# Patient Record
Sex: Female | Born: 1995 | Race: White | Hispanic: No | Marital: Single | Smoking: Never smoker
Health system: Southern US, Community
[De-identification: ages and names within clinical notes are randomized; demographics above are authoritative.]

## PROBLEM LIST (undated history)

## (undated) DIAGNOSIS — K589 Irritable bowel syndrome without diarrhea: Secondary | ICD-10-CM

## (undated) HISTORY — DX: Irritable bowel syndrome, unspecified: K58.9

## (undated) HISTORY — PX: WISDOM TOOTH EXTRACTION: SHX21

---
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
## 2017-01-03 ENCOUNTER — Ambulatory Visit: Payer: Self-pay | Admitting: Family Medicine

## 2017-01-10 ENCOUNTER — Ambulatory Visit (INDEPENDENT_AMBULATORY_CARE_PROVIDER_SITE_OTHER): Payer: Managed Care, Other (non HMO) | Admitting: Family Medicine

## 2017-01-10 ENCOUNTER — Encounter: Payer: Self-pay | Admitting: Family Medicine

## 2017-01-10 VITALS — BP 98/60 | HR 76 | Temp 98.9°F | Resp 16 | Ht 65.5 in | Wt 117.0 lb

## 2017-01-10 DIAGNOSIS — K580 Irritable bowel syndrome with diarrhea: Secondary | ICD-10-CM

## 2017-01-10 DIAGNOSIS — Z7689 Persons encountering health services in other specified circumstances: Secondary | ICD-10-CM | POA: Diagnosis not present

## 2017-01-10 DIAGNOSIS — K529 Noninfective gastroenteritis and colitis, unspecified: Secondary | ICD-10-CM | POA: Diagnosis not present

## 2017-01-10 LAB — COMPLETE METABOLIC PANEL WITH GFR
ALT: 15 U/L (ref 6–29)
AST: 18 U/L (ref 10–30)
Albumin: 4.9 g/dL (ref 3.6–5.1)
Alkaline Phosphatase: 45 U/L (ref 33–115)
BUN: 11 mg/dL (ref 7–25)
CO2: 23 mmol/L (ref 20–31)
Calcium: 9.9 mg/dL (ref 8.6–10.2)
Chloride: 103 mmol/L (ref 98–110)
Creat: 0.75 mg/dL (ref 0.50–1.10)
GFR, Est African American: >89 mL/min (ref >=60)
GFR, Est Non African American: >89 mL/min (ref >=60)
Glucose, Bld: 81 mg/dL (ref 65–99)
Potassium: 4.2 mmol/L (ref 3.5–5.3)
Sodium: 139 mmol/L (ref 135–146)
Total Bilirubin: 0.4 mg/dL (ref 0.2–1.2)
Total Protein: 7.7 g/dL (ref 6.1–8.1)

## 2017-01-10 LAB — URINALYSIS, ROUTINE W REFLEX MICROSCOPIC
Bilirubin Urine: NEGATIVE
Glucose, UA: NEGATIVE
Ketones, ur: NEGATIVE
Leukocytes, UA: NEGATIVE
Nitrite: NEGATIVE
Specific Gravity, Urine: 1.027 (ref 1.001–1.035)
pH: 6 (ref 5.0–8.0)

## 2017-01-10 LAB — CBC WITH DIFFERENTIAL/PLATELET
Basophils Absolute: 45 cells/uL (ref 0–200)
Basophils Relative: 1 %
Eosinophils Absolute: 180 cells/uL (ref 15–500)
Eosinophils Relative: 4 %
HCT: 44.3 % (ref 35.0–45.0)
Hemoglobin: 14.6 g/dL (ref 11.7–15.5)
Lymphocytes Relative: 27 %
Lymphs Abs: 1215 cells/uL (ref 850–3900)
MCH: 27.8 pg (ref 27.0–33.0)
MCHC: 33 g/dL (ref 32.0–36.0)
MCV: 84.2 fL (ref 80.0–100.0)
MPV: 10 fL (ref 7.5–12.5)
Monocytes Absolute: 405 cells/uL (ref 200–950)
Monocytes Relative: 9 %
Neutro Abs: 2655 cells/uL (ref 1500–7800)
Neutrophils Relative %: 59 %
Platelets: 245 10*3/uL (ref 140–400)
RBC: 5.26 MIL/uL — ABNORMAL HIGH (ref 3.80–5.10)
RDW: 13.9 % (ref 11.0–15.0)
WBC: 4.5 10*3/uL (ref 3.8–10.8)

## 2017-01-10 LAB — URINALYSIS, MICROSCOPIC ONLY
Casts: NONE SEEN [LPF]
Squamous Epithelial / HPF: NONE SEEN [HPF] (ref <=5)
Yeast: NONE SEEN [HPF]

## 2017-01-10 NOTE — Progress Notes (Signed)
Chief Complaint  Patient presents with  . Establish Care   Healthy 21 year old Lives at home Works and attends college Up to date with immunizations Not sexually active  New to establish  Only complaint is of chronic diarrhea.  Present for 2 years.  No blood or mucous or weight loss.  Worse with certain foods.  Worse with stress.  Eating large meal seems to stimulate bowel and results in diarrhea.  Afraid to eat at restaurants. Has tried food elimination diets.  Is not lactose intolerant.  Does not think it is gluten.   Sees some improvement with peppermint oil.  Has  Not tried any imodium or probiotics or other supplements.  Patient Active Problem List   Diagnosis Date Noted  . Irritable bowel syndrome with diarrhea 01/10/2017    Outpatient Encounter Prescriptions as of 01/10/2017  Medication Sig  . ibuprofen (ADVIL,MOTRIN) 200 MG tablet Take 200 mg by mouth every 6 (six) hours as needed.   No facility-administered encounter medications on file as of 01/10/2017.     No past medical history on file.  Past Surgical History:  Procedure Laterality Date  . WISDOM TOOTH EXTRACTION      Social History   Social History  . Marital status: Single    Spouse name: N/A  . Number of children: N/A  . Years of education: 57   Occupational History  . student    Social History Main Topics  . Smoking status: Never Smoker  . Smokeless tobacco: Never Used  . Alcohol use No  . Drug use: No  . Sexual activity: Yes   Other Topics Concern  . 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 Diabetes Maternal Grandfather   . Diabetes Paternal Grandmother   . Cancer Paternal Grandfather     "abdominal"    Review of Systems  Constitutional: Negative for chills, fever and weight loss.  HENT: Negative for congestion and hearing loss.   Eyes: Negative  for blurred vision and pain.  Respiratory: Negative for cough and shortness of breath.   Cardiovascular: Negative for chest pain and leg swelling.  Gastrointestinal: Positive for abdominal pain and diarrhea. Negative for constipation and heartburn.  Genitourinary: Negative for dysuria and frequency.  Musculoskeletal: Negative for falls, joint pain and myalgias.  Neurological: Negative for dizziness, seizures and headaches.  Psychiatric/Behavioral: Negative for depression. The patient is not nervous/anxious and does not have insomnia.     BP 98/60 (BP Location: Right Arm, Patient Position: Sitting, Cuff Size: Normal)   Pulse 76   Temp 98.9 F (37.2 C) (Temporal)   Resp 16   Ht 5' 5.5" (1.664 m)   Wt 117 lb 0.6 oz (53.1 kg)   LMP 01/09/2017 (Exact Date)   SpO2 100%   BMI 19.18 kg/m   Physical Exam  Constitutional: She is oriented to person, place, and time. She appears well-developed and well-nourished.  HENT:  Head: Normocephalic and atraumatic.  Right Ear: External ear normal.  Left Ear: External ear normal.  Mouth/Throat: Oropharynx is clear and moist.  Eyes: Conjunctivae are normal. Pupils are equal, round, and reactive to light.  Neck: Normal range of motion. Neck supple. No thyromegaly present.  Cardiovascular: Normal rate, regular rhythm and normal heart sounds.   Pulmonary/Chest: Effort normal and breath sounds normal. No  respiratory distress.  Abdominal: Soft. Bowel sounds are normal.  Musculoskeletal: Normal range of motion. She exhibits no edema.  Lymphadenopathy:    She has no cervical adenopathy.  Neurological: She is alert and oriented to person, place, and time.  Gait normal  Skin: Skin is warm and dry.  Psychiatric: She has a normal mood and affect. Her behavior is normal. Thought content normal.  Nursing note and vitals reviewed.  ASSESSMENT/PLAN:  1. Chronic diarrhea  - CBC with Differential/Platelet - COMPLETE METABOLIC PANEL WITH GFR - Urinalysis,  Routine w reflex microscopic - C-reactive protein - Celiac panel 10  2. Irritable bowel syndrome with diarrhea  Discussed IBS.  Discussed fiber diet.  Discussed D-Dx of inflammatory bowel disease, food allergy and IBS.    - CBC with Differential/Platelet - COMPLETE METABOLIC PANEL WITH GFR - Urinalysis, Routine w reflex microscopic - C-reactive protein - Celiac panel 10  3. New to establish with doctor   Patient Instructions  Drink plenty of water Take imodium as needed for loose BM Labs today See me in a month    Diet for Irritable Bowel Syndrome When you have irritable bowel syndrome (IBS), the foods you eat and your eating habits are very important. IBS may cause various symptoms, such as abdominal pain, constipation, or diarrhea. Choosing the right foods can help ease discomfort caused by these symptoms. Work with your health care provider and dietitian to find the best eating plan to help control your symptoms. What general guidelines do I need to follow?  Keep a food diary. This will help you identify foods that cause symptoms. Write down:  What you eat and when.  What symptoms you have.  When symptoms occur in relation to your meals.  Avoid foods that cause symptoms. Talk with your dietitian about other ways to get the same nutrients that are in these foods.  Eat more foods that contain fiber. Take a fiber supplement if directed by your dietitian.  Eat your meals slowly, in a relaxed setting.  Aim to eat 5-6 small meals per day. Do not skip meals.  Drink enough fluids to keep your urine clear or pale yellow.  Ask your health care provider if you should take an over-the-counter probiotic during flare-ups to help restore healthy gut bacteria.  If you have cramping or diarrhea, try making your meals low in fat and high in carbohydrates. Examples of carbohydrates are pasta, rice, whole grain breads and cereals, fruits, and vegetables.  If dairy products cause  your symptoms to flare up, try eating less of them. You might be able to handle yogurt better than other dairy products because it contains bacteria that help with digestion. What foods are not recommended? The following are some foods and drinks that may worsen your symptoms:  Fatty foods, such as [place] fries.  Milk products, such as cheese or ice cream.  Chocolate.  Alcohol.  Products with caffeine, such as coffee.  Carbonated drinks, such as soda. The items listed above may not be a complete list of foods and beverages to avoid. Contact your dietitian for more information.  What foods are good sources of fiber? Your health care provider or dietitian may recommend that you eat more foods that contain fiber. Fiber can help reduce constipation and other IBS symptoms. Add foods with fiber to your diet a little at a time so that your body can get used to them. Too much fiber at once might cause gas and swelling of your abdomen. The  following are some foods that are good sources of fiber:  Apples.  Peaches.  Pears.  Berries.  Figs.  Broccoli (raw).  Cabbage.  Carrots.  Raw peas.  Kidney beans.  Lima beans.  Whole grain bread.  Whole grain cereal. Where to find more information: [employer] for Functional Gastrointestinal Disorders: www.iffgd.[employer] of Diabetes and Digestive and Kidney Diseases: http://norris-lawson.com/.aspx This information is not intended to replace advice given to you by your health care provider. Make sure you discuss any questions you have with your health care provider. Document Released: 12/09/2003 Document Revised: 02/24/2016 Document Reviewed: 12/19/2013 Elsevier Interactive Patient Education  2017 Elsevier Inc.    Eustace Moore, MD

## 2017-01-10 NOTE — Patient Instructions (Addendum)
Drink plenty of water Take imodium as needed for loose BM Labs today See me in a month    Diet for Irritable Bowel Syndrome When you have irritable bowel syndrome (IBS), the foods you eat and your eating habits are very important. IBS may cause various symptoms, such as abdominal pain, constipation, or diarrhea. Choosing the right foods can help ease discomfort caused by these symptoms. Work with your health care provider and dietitian to find the best eating plan to help control your symptoms. What general guidelines do I need to follow?  Keep a food diary. This will help you identify foods that cause symptoms. Write down:  What you eat and when.  What symptoms you have.  When symptoms occur in relation to your meals.  Avoid foods that cause symptoms. Talk with your dietitian about other ways to get the same nutrients that are in these foods.  Eat more foods that contain fiber. Take a fiber supplement if directed by your dietitian.  Eat your meals slowly, in a relaxed setting.  Aim to eat 5-6 small meals per day. Do not skip meals.  Drink enough fluids to keep your urine clear or pale yellow.  Ask your health care provider if you should take an over-the-counter probiotic during flare-ups to help restore healthy gut bacteria.  If you have cramping or diarrhea, try making your meals low in fat and high in carbohydrates. Examples of carbohydrates are pasta, rice, whole grain breads and cereals, fruits, and vegetables.  If dairy products cause your symptoms to flare up, try eating less of them. You might be able to handle yogurt better than other dairy products because it contains bacteria that help with digestion. What foods are not recommended? The following are some foods and drinks that may worsen your symptoms:  Fatty foods, such as [place] fries.  Milk products, such as cheese or ice cream.  Chocolate.  Alcohol.  Products with caffeine, such as coffee.  Carbonated  drinks, such as soda. The items listed above may not be a complete list of foods and beverages to avoid. Contact your dietitian for more information.  What foods are good sources of fiber? Your health care provider or dietitian may recommend that you eat more foods that contain fiber. Fiber can help reduce constipation and other IBS symptoms. Add foods with fiber to your diet a little at a time so that your body can get used to them. Too much fiber at once might cause gas and swelling of your abdomen. The following are some foods that are good sources of fiber:  Apples.  Peaches.  Pears.  Berries.  Figs.  Broccoli (raw).  Cabbage.  Carrots.  Raw peas.  Kidney beans.  Lima beans.  Whole grain bread.  Whole grain cereal. Where to find more information: [employer] for Functional Gastrointestinal Disorders: www.iffgd.[employer] of Diabetes and Digestive and Kidney Diseases: http://norris-lawson.com/.aspx This information is not intended to replace advice given to you by your health care provider. Make sure you discuss any questions you have with your health care provider. Document Released: 12/09/2003 Document Revised: 02/24/2016 Document Reviewed: 12/19/2013 Elsevier Interactive Patient Education  2017 [employer].

## 2017-01-11 LAB — TISSUE TRANSGLUTAMINASE, IGG: Tissue Transglut Ab: 7 U/mL — ABNORMAL HIGH (ref <6)

## 2017-01-11 LAB — ENDOMYSIAL AB IGA RFLX TITER: Endomysial Screen: NEGATIVE

## 2017-01-11 LAB — CELIAC DISEASE COMPREHENSIVE PANEL WITH REFLEXES
IgA: 171 mg/dL (ref 81–463)
Tissue Transglutaminase Ab, IgA: 1 U/mL (ref <4)

## 2017-01-11 LAB — C-REACTIVE PROTEIN: CRP: 0.8 mg/L (ref <8.0)

## 2017-02-14 ENCOUNTER — Ambulatory Visit (INDEPENDENT_AMBULATORY_CARE_PROVIDER_SITE_OTHER): Payer: Managed Care, Other (non HMO) | Admitting: Family Medicine

## 2017-02-14 ENCOUNTER — Encounter: Payer: Self-pay | Admitting: Family Medicine

## 2017-02-14 VITALS — BP 108/62 | HR 68 | Temp 98.3°F | Resp 16 | Ht 66.0 in | Wt 120.1 lb

## 2017-02-14 DIAGNOSIS — K580 Irritable bowel syndrome with diarrhea: Secondary | ICD-10-CM | POA: Diagnosis not present

## 2017-02-14 MED ORDER — DICYCLOMINE HCL 10 MG PO CAPS: 10.0000 mg | ORAL_CAPSULE | Freq: Three times a day (TID) | ORAL | 1 refills | Status: DC

## 2017-02-14 NOTE — Progress Notes (Signed)
    Chief Complaint  Patient presents with  . Follow-up   Feels well in general Still with IBS-D symptoms a couple of times a month, for a few days Not on high fiber diet.  Recommend fiber supplement if cannot get in foods Labs reviewed and are generally normal  Patient Active Problem List   Diagnosis Date Noted  . Irritable bowel syndrome with diarrhea 01/10/2017    Outpatient Encounter Prescriptions as of 02/14/2017  Medication Sig  . ibuprofen (ADVIL,MOTRIN) 200 MG tablet Take 200 mg by mouth every 6 (six) hours as needed.  . dicyclomine (BENTYL) 10 MG capsule Take 1 capsule (10 mg total) by mouth 4 (four) times daily -  before meals and at bedtime.   No facility-administered encounter medications on file as of 02/14/2017.     No Known Allergies  Review of Systems  Constitutional: Negative for activity change, appetite change, fatigue and unexpected weight change.  Gastrointestinal: Positive for diarrhea. Negative for blood in stool and constipation.  Genitourinary: Negative for difficulty urinating and frequency.  Musculoskeletal: Negative for arthralgias and back pain.  Neurological: Negative for dizziness and headaches.  All other systems reviewed and are negative.   BP 108/62 (BP Location: Right Arm, Patient Position: Sitting, Cuff Size: Normal)   Pulse 68   Temp 98.3 F (36.8 C) (Temporal)   Resp 16   Ht 5\' 6"  (1.676 m)   Wt 120 lb 1.9 oz (54.5 kg)   LMP 02/10/2017 (Exact Date)   SpO2 100%   BMI 19.39 kg/m   Physical Exam  Constitutional: She is oriented to person, place, and time. She appears well-developed and well-nourished. No distress.  Cardiovascular: Normal rate, regular rhythm and normal heart sounds.   Pulmonary/Chest: Effort normal and breath sounds normal.  Abdominal: Soft. Bowel sounds are normal. She exhibits no distension. There is no tenderness.  Neurological: She is alert and oriented to person, place, and time.  Psychiatric: She has a normal  mood and affect. Her behavior is normal. Thought content normal.    ASSESSMENT/PLAN:  1. Irritable bowel syndrome with diarrhea  - Ambulatory referral to Gastroenterology   Patient Instructions  High fiber diet is desirable Drink lots of water Take the bentyl as needed See Dr Karilyn Cotaehman in consultation See me yearly    Eustace MooreYvonne Sue Minnie Shi, MD

## 2017-02-14 NOTE — Patient Instructions (Signed)
High fiber diet is desirable Drink lots of water Take the bentyl as needed See Dr Karilyn Cotaehman in consultation See me yearly

## 2017-02-22 ENCOUNTER — Encounter: Payer: Self-pay | Admitting: Gastroenterology

## 2017-03-26 ENCOUNTER — Ambulatory Visit (INDEPENDENT_AMBULATORY_CARE_PROVIDER_SITE_OTHER): Payer: Managed Care, Other (non HMO) | Admitting: Nurse Practitioner

## 2017-03-26 ENCOUNTER — Encounter: Payer: Self-pay | Admitting: Nurse Practitioner

## 2017-03-26 VITALS — BP 111/67 | HR 83 | Temp 97.6°F | Ht 66.0 in | Wt 115.6 lb

## 2017-03-26 DIAGNOSIS — K580 Irritable bowel syndrome with diarrhea: Secondary | ICD-10-CM

## 2017-03-26 NOTE — Patient Instructions (Signed)
1. Keep taking Bentyl 10 mg up to 4 times a day as needed. 2. Try to learn if your body tends to be one where you are made aware of impending problems versus "surprise symptoms." 3. Call us with any worsening or severe problems. 4. Otherwise, return for follow-up in 3 months.

## 2017-03-26 NOTE — Progress Notes (Signed)
CC'D TO PCP °

## 2017-03-26 NOTE — Assessment & Plan Note (Signed)
The patient has intermittent diarrhea which seems to be worse with stress. Recently started a new job within the past year and this is when her mom noted that her diarrhea symptoms really increased. She was prescribed Bentyl by her primary care and this is actually worked quite well for her. She does not have diarrhea every day, about 30% of bowel movements or diarrhea in the remainder of consistent with [facility] 4. Celiac panel IgG was very mildly elevated, other tests in the panel were negative. I have a low suspicion for celiac disease at this point. She likely just has your bowel syndrome diarrhea type. I will have her continue Bentyl 10 mg up to 4 times a day as needed. We discussed when necessary dosing and learning how her body works as far as if she will have any notice of impending issues.   Return for follow-up in 3 months.

## 2017-03-26 NOTE — Progress Notes (Signed)
Primary Care Physician:  Eustace Moore, MD Primary Gastroenterologist:  Dr. Darrick Penna  Chief Complaint  Patient presents with  . Irritable Bowel Syndrome    HPI:   Jennifer Potts is a 21 y.o. female who presents on referral from primary care. PCP notes reviewed. The patient was seen on 01/10/2017 at which point she complained of chronic diarrhea for 2 years without blood, mucus, weight loss. Worse with stress. Has tried food elimination diets, not lactose intolerant. Does not think it is gluten. Some improvement with peppermint oil, no other treatments tried. Labs are ordered. Follow-up visit with persistent diarrhea symptoms. She was given Bentyl and referred to GI.  Reviewed labs and CBC, CMP, CRP essentially normal. Celiac panel essentially normal except for tissue transglutaminase IgG which was mildly positive at 7 (upper limit normal is 6).  Today she states she's doing well. She started a new job about a year ago. She has had diarrhea for about 1-2 years. Diarrhea isn't every day, no identified triggers related to foods. Bentyl has helped; prior to Bentyl, was having 30% diarrhea and 7/10 are consistent with [facility] 4. Occasional lower abdominal pain which has improved with Bentyl. Denies hematochezia, melena, N/V, unintentional weight loss, fever, chills, acute changes in bowel movements. Denies chest pain, dyspnea, dizziness, lightheadedness, syncope, near syncope. Denies any other upper or lower GI symptoms.  No past medical history on file.  Past Surgical History:  Procedure Laterality Date  . WISDOM TOOTH EXTRACTION      Current Outpatient Prescriptions  Medication Sig Dispense Refill  . dicyclomine (BENTYL) 10 MG capsule Take 1 capsule (10 mg total) by mouth 4 (four) times daily -  before meals and at bedtime. 100 capsule 1  . ibuprofen (ADVIL,MOTRIN) 200 MG tablet Take 200 mg by mouth every 6 (six) hours as needed.     No current facility-administered medications  for this visit.     Allergies as of 03/26/2017  . (No Known Allergies)    Family History  Problem Relation Age of Onset  . Diabetes Maternal Grandfather   . Diabetes Paternal Grandmother   . Colon cancer Paternal Grandmother   . Cancer Paternal Grandfather        "abdominal"    Social History   Social History  . Marital status: Single    Spouse name: N/A  . Number of children: N/A  . Years of education: 79   Occupational History  . student    Social History Main Topics  . Smoking status: Never Smoker  . Smokeless tobacco: Never Used  . Alcohol use No  . Drug use: No  . Sexual activity: Yes   Other Topics Concern  . 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    Physical Exam: BP 111/67   Pulse 83   Temp 97.6 F (36.4 C) (Oral)   Ht 5\' 6"  (1.676 m)   Wt 115 lb 9.6 oz (52.4 kg)   LMP 03/12/2017   BMI 18.66 kg/m  General:   Thin female. Alert and oriented. Pleasant and cooperative. Well-nourished and well-developed.  Head:  Normocephalic and atraumatic. Eyes:  Without icterus, sclera clear and conjunctiva pink.  Ears:  Normal auditory acuity. Cardiovascular:  S1, S2 present without murmurs appreciated. Extremities without clubbing or edema. Respiratory:  Clear  to auscultation bilaterally. No wheezes, rales, or rhonchi. No distress.  Gastrointestinal:  +BS, soft, non-tender and non-distended. No HSM noted. No guarding or rebound. No masses appreciated.  Rectal:  Deferred  Musculoskalatal:  Symmetrical without gross deformities. Neurologic:  Alert and oriented x4;  grossly normal neurologically. Psych:  Alert and cooperative. Normal mood and affect. Heme/Lymph/Immune: No excessive bruising noted.    03/26/2017 8:32 AM   Disclaimer: This note was dictated with voice recognition software. Similar sounding words  can inadvertently be transcribed and may not be corrected upon review.

## 2017-04-08 NOTE — Progress Notes (Signed)
REVIEWED-NO ADDITIONAL RECOMMENDATIONS. 

## 2017-06-26 ENCOUNTER — Ambulatory Visit (INDEPENDENT_AMBULATORY_CARE_PROVIDER_SITE_OTHER): Payer: Managed Care, Other (non HMO) | Admitting: Nurse Practitioner

## 2017-06-26 ENCOUNTER — Encounter: Payer: Self-pay | Admitting: Nurse Practitioner

## 2017-06-26 VITALS — BP 112/73 | HR 95 | Temp 98.4°F | Ht 65.0 in | Wt 119.2 lb

## 2017-06-26 DIAGNOSIS — K580 Irritable bowel syndrome with diarrhea: Secondary | ICD-10-CM

## 2017-06-26 NOTE — Patient Instructions (Signed)
1. Continue taking Bentyl as you need to, up to 4 times a day. 2. Return for follow-up in 6 months. 3. Call us if you have any worsening symptoms before then. 4. Call us if you have any questions or concerns.

## 2017-06-26 NOTE — Assessment & Plan Note (Addendum)
The patient overall is doing quite well on Bentyl 10 mg up to 4 times a day. On average she has to take it one to 2 times a day. Just be worse with stressors. We talked about identifying upcoming stressful times and be mindful that she may need more frequent medication. She is happy now that she can go out to eat without having to rush [place] have an embarrassing moment. Overall she is satisfied with how Bentyl is working. 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 She is also cut back on gluten in her diet and she feels this is helped as well. She is not on a gluten elimination diet however. Recommend continue current medications, return for follow-up in 6 months for long-term symptom trend.

## 2017-06-26 NOTE — Progress Notes (Signed)
cc'ed to pcp °

## 2017-06-26 NOTE — Progress Notes (Signed)
Referring Provider: Eustace Moore, MD Primary Care Physician:  Eustace Moore, MD Primary GI:  Dr. Darrick Penna  Chief Complaint  Patient presents with  . Irritable Bowel Syndrome    diarrhea better    HPI:   Jennifer Potts is a 21 y.o. female who presents for Follow-up on IBS-D. The patient was last seen by primary care 02/14/2017. PCP note reviewed. At that time it was noted she still has IBS-D symptoms a couple times a month for a few days, not on high fiber diet. Recommended fiber supplement. Recommended continue Bentyl as needed, referral to GI. Review previous notes indicate chronic diarrhea for 2 years without blood, mucus, weight loss. Worse with certain foods and worse with stress. Has tried food elimination diets including lactose which was not effective. Also tried gluten-free.  She was seen by [place] on 03/26/2017 for IBS diarrhea-type. At that time she was doing well overall, recently started a new job and noted chronic diarrhea for 1-2 years without identifiable trigger. Bentyl was helping at that point. No other GI symptoms. Recommended Bentyl 10 mg 4 times a day, learn triggers and try to avoid them, follow-up in 3 months.  Labs reviewed including tissue transglutaminase, celiac panel, CRP, CBC, CMP which were all essentially normal.  No history of colonoscopy or endoscopy in our system.  Today she states she's doing better. On Bentyl which is helping. Is taking it at least once a day, sometimes twice. It works rapidly. Has about 1-2 bowel movements a day, more consistent with [place] 4. Still has gallbladder insitu. Overall she's happy with how things are going at this point. She has cut back on gluten (not eliminated) and notes a difference. Denies abdominal pain, N/V, hematochezia, melena, fever, chills, unintentional weight loss. Denies chest pain, dyspnea, dizziness, lightheadedness, syncope, near syncope. Denies any other upper or lower GI symptoms.  Past Medical  History:  Diagnosis Date  . IBS (irritable bowel syndrome)    Diarrhea type    Past Surgical History:  Procedure Laterality Date  . WISDOM TOOTH EXTRACTION      Current Outpatient Prescriptions  Medication Sig Dispense Refill  . dicyclomine (BENTYL) 10 MG capsule Take 1 capsule (10 mg total) by mouth 4 (four) times daily -  before meals and at bedtime. (Patient taking differently: Take 10 mg by mouth 4 (four) times daily -  before meals and at bedtime. Takes 1-2 daily) 100 capsule 1  . ibuprofen (ADVIL,MOTRIN) 200 MG tablet Take 200 mg by mouth every 6 (six) hours as needed.     No current facility-administered medications for this visit.     Allergies as of 06/26/2017  . (No Known Allergies)    Family History  Problem Relation Age of Onset  . Diabetes Maternal Grandfather   . Diabetes Paternal Grandmother   . Colon cancer Paternal Grandmother   . Cancer Paternal Grandfather        "abdominal"    Social History   Social History  . Marital status: Single    Spouse name: N/A  . Number of children: N/A  . Years of education: 80   Occupational History  . student    Social History Main Topics  . Smoking status: Never Smoker  . Smokeless tobacco: Never Used  . Alcohol use No  . Drug use: No  . Sexual activity: Yes   Other Topics Concern  . 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   Physical Exam: BP 112/73   Pulse 95   Temp 98.4 F (36.9 C) (Oral)   Ht  (1.651 m)   Wt 119 lb 3.2 oz (54.1 kg)   LMP 06/05/2017 (Approximate)   BMI 19.84 kg/m  General:   Alert and oriented. Pleasant and cooperative. Well-nourished and well-developed.  Eyes:  Without icterus, sclera clear and conjunctiva pink.  Ears:  Normal auditory acuity. Cardiovascular:  S1, S2 present without murmurs appreciated. Extremities without clubbing  or edema. Respiratory:  Clear to auscultation bilaterally. No wheezes, rales, or rhonchi. No distress.  Gastrointestinal:  +BS, soft, non-tender and non-distended. No HSM noted. No guarding or rebound. No masses appreciated.  Rectal:  Deferred  Musculoskalatal:  Symmetrical without gross deformities. Neurologic:  Alert and oriented x4;  grossly normal neurologically. Psych:  Alert and cooperative. Normal mood and affect. Heme/Lymph/Immune: No excessive bruising noted.    06/26/2017 8:36 AM   Disclaimer: This note was dictated with voice recognition software. Similar sounding words can inadvertently be transcribed and may not be corrected upon review.

## 2017-09-18 ENCOUNTER — Other Ambulatory Visit: Payer: Self-pay

## 2017-09-19 MED ORDER — DICYCLOMINE HCL 10 MG PO CAPS: 10.0000 mg | ORAL_CAPSULE | Freq: Three times a day (TID) | ORAL | 5 refills | Status: DC

## 2017-12-24 ENCOUNTER — Ambulatory Visit: Payer: Managed Care, Other (non HMO) | Admitting: Nurse Practitioner

## 2017-12-25 ENCOUNTER — Ambulatory Visit (INDEPENDENT_AMBULATORY_CARE_PROVIDER_SITE_OTHER): Payer: 59 | Admitting: Family Medicine

## 2017-12-25 ENCOUNTER — Encounter: Payer: Self-pay | Admitting: Family Medicine

## 2017-12-25 ENCOUNTER — Other Ambulatory Visit: Payer: Self-pay

## 2017-12-25 VITALS — BP 116/70 | HR 80 | Temp 98.0°F | Resp 12 | Ht 66.0 in | Wt 121.1 lb

## 2017-12-25 DIAGNOSIS — L03032 Cellulitis of left toe: Secondary | ICD-10-CM | POA: Diagnosis not present

## 2017-12-25 MED ORDER — TRIAMCINOLONE ACETONIDE 0.5 % EX OINT: 1.0000 "application " | TOPICAL_OINTMENT | Freq: Two times a day (BID) | CUTANEOUS | 0 refills | Status: DC

## 2017-12-25 MED ORDER — SULFAMETHOXAZOLE-TRIMETHOPRIM 800-160 MG PO TABS: 1.0000 | ORAL_TABLET | Freq: Two times a day (BID) | ORAL | 0 refills | Status: DC

## 2017-12-25 NOTE — Patient Instructions (Signed)
Antibiotic twice a day Triamcinolone ointment twice a day Call if not  better in a week or two to see podiatry

## 2017-12-25 NOTE — Progress Notes (Signed)
    Chief Complaint  Patient presents with  . Toe Pain    Big toe on LEFT foot worse, big toe on right foot also   Patient is here for an acute visit. She states that she had a pedicure in December.  She felt that the nail salon technician was a little bit aggressive and cutting and pushing back the cuticles of her nails.  She was sore the next day.  Over the following weeks she continued to have some soreness around the cuticle area left greater than right.  It has gotten more swollen and red the last couple of weeks.  It is never felt right since her pedicure.  The left toe is greater than the right.  No drainage.  No fever.  No red streaks.  No prior problems with skin or nails.  Patient Active Problem List   Diagnosis Date Noted  . Irritable bowel syndrome with diarrhea 01/10/2017    Outpatient Encounter Medications as of 12/25/2017  Medication Sig  . dicyclomine (BENTYL) 10 MG capsule Take 1 capsule (10 mg total) by mouth 4 (four) times daily -  before meals and at bedtime.  Marland Kitchen. ibuprofen (ADVIL,MOTRIN) 200 MG tablet Take 200 mg by mouth every 6 (six) hours as needed.  . sulfamethoxazole-trimethoprim (BACTRIM DS,SEPTRA DS) 800-160 MG tablet Take 1 tablet by mouth 2 (two) times daily.  Marland Kitchen. triamcinolone ointment (KENALOG) 0.5 % Apply 1 application topically 2 (two) times daily.   No facility-administered encounter medications on file as of 12/25/2017.     No Known Allergies  Review of Systems  Constitutional: Negative for activity change, appetite change, fatigue and unexpected weight change.  Gastrointestinal: Negative for blood in stool, constipation and diarrhea.       GI symptoms currently well controlled  Genitourinary: Negative for difficulty urinating and frequency.  Musculoskeletal: Negative for arthralgias and back pain.  Skin: Positive for color change.       Toes  Neurological: Negative for dizziness and headaches.  All other systems reviewed and are negative.   BP  116/70   Pulse 80   Temp 98 F (36.7 C)   Resp 12   Ht 5\' 6"  (1.676 m)   Wt 121 lb 1.3 oz (54.9 kg)   BMI 19.54 kg/m   Physical Exam  Constitutional: She is oriented to person, place, and time. She appears well-developed and well-nourished. No distress.  HENT:  Head: Normocephalic and atraumatic.  Mouth/Throat: Oropharynx is clear and moist.  Musculoskeletal:       Feet:  Neurological: She is alert and oriented to person, place, and time.  Psychiatric: She has a normal mood and affect. Her behavior is normal.    ASSESSMENT/PLAN:  1. Paronychia of great toe, left Left greater than right    Patient Instructions  Antibiotic twice a day Triamcinolone ointment twice a day Call if not  better in a week or two to see podiatry   Eustace MooreYvonne Sue Adreana Coull, MD

## 2018-01-04 ENCOUNTER — Encounter: Payer: Self-pay | Admitting: Family Medicine

## 2018-01-04 ENCOUNTER — Ambulatory Visit (INDEPENDENT_AMBULATORY_CARE_PROVIDER_SITE_OTHER): Payer: 59 | Admitting: Family Medicine

## 2018-01-04 ENCOUNTER — Telehealth: Payer: Self-pay | Admitting: Family Medicine

## 2018-01-04 ENCOUNTER — Other Ambulatory Visit: Payer: Self-pay

## 2018-01-04 VITALS — BP 102/60 | HR 75 | Temp 98.3°F | Ht 66.0 in | Wt 123.0 lb

## 2018-01-04 DIAGNOSIS — L03032 Cellulitis of left toe: Secondary | ICD-10-CM | POA: Diagnosis not present

## 2018-01-04 MED ORDER — SULFAMETHOXAZOLE-TRIMETHOPRIM 800-160 MG PO TABS
1.0000 | ORAL_TABLET | Freq: Two times a day (BID) | ORAL | 0 refills | Status: DC
Start: 2018-01-04 — End: 2018-05-28

## 2018-01-04 NOTE — Patient Instructions (Addendum)
antibiotic refilled Fill and use IF infection worsens before you see podiatry  Urgent referral to podiatry

## 2018-01-04 NOTE — Progress Notes (Signed)
Chief Complaint  Patient presents with  . Toe Pain    right big toe hit toe, green discharge came out, been on antibiotics    Patient is here for an acute visit.  She was here last week for paronychia.  She had redness, swelling, and pain in both of her great toes resulting from a manicure.  At the time I thought it was a bacterial infection and started her on Septra DS.  I also gave her some cortisone ointment, since this helps with the chronic paronychia.  Gave her instructions on soaks and to come back if this did not do the trick. Patient reports that she is tolerating the antibiotic.  She has not seen any change in the redness swelling or pain in her toes.  In addition she accidentally stubbed 1 of her toes (right) and green purulence bubbled up at the cuticle edge.  At this point she prefers referral to podiatry.  She is here today with her mother who expresses concern.  Patient Active Problem List   Diagnosis Date Noted  . Irritable bowel syndrome with diarrhea 01/10/2017    Outpatient Encounter Medications as of 01/04/2018  Medication Sig  . dicyclomine (BENTYL) 10 MG capsule Take 1 capsule (10 mg total) by mouth 4 (four) times daily -  before meals and at bedtime.  Marland Kitchen. ibuprofen (ADVIL,MOTRIN) 200 MG tablet Take 200 mg by mouth every 6 (six) hours as needed.  . sulfamethoxazole-trimethoprim (BACTRIM DS,SEPTRA DS) 800-160 MG tablet Take 1 tablet by mouth 2 (two) times daily.  Marland Kitchen. triamcinolone ointment (KENALOG) 0.5 % Apply 1 application topically 2 (two) times daily.   No facility-administered encounter medications on file as of 01/04/2018.     No Known Allergies  Review of Systems  Constitutional: Negative for activity change, appetite change, fatigue and unexpected weight change.  Gastrointestinal: Negative for blood in stool, constipation and diarrhea.       GI symptoms currently well controlled  Genitourinary: Negative for difficulty urinating and frequency.  Musculoskeletal:  Negative for arthralgias and back pain.  Skin: Positive for color change.       Toes  Neurological: Negative for dizziness and headaches.  All other systems reviewed and are negative.   Physical Exam  Constitutional: She is oriented to person, place, and time. She appears well-developed and well-nourished. No distress.  HENT:  Head: Normocephalic and atraumatic.  Mouth/Throat: Oropharynx is clear and moist.  Musculoskeletal:       Feet:  Neurological: She is alert and oriented to person, place, and time.  Psychiatric: She has a normal mood and affect. Her behavior is normal.   Exam is unchanged.  I was unable to express purulence from either cuticle edge BP 102/60   Pulse 75   Temp 98.3 F (36.8 C) (Oral)   Ht 5\' 6"  (1.676 m)   Wt 123 lb (55.8 kg)   SpO2 92%   BMI 19.85 kg/m    ASSESSMENT/PLAN:  1. Paronychia of great toe, left Podiatry referral is placed.  Patient's going to finish her antibiotics tomorrow.  She will continue the warm soaks.  I told her that it would be preferable to finish her antibiotic since stay off for a couple of days before she sees podiatry.  I explained the podiatrist may want to do a culture.  If she has a flare of infection symptoms, redness pus pain or red streaks up her leg, fever that she could restart the antibiotic.  A refill was  offered to her. - Ambulatory referral to Podiatry   Patient Instructions  antibiotic refilled Fill and use IF infection worsens before you see podiatry  Urgent referral to podiatry    Eustace Moore, MD

## 2018-01-04 NOTE — Telephone Encounter (Signed)
Patients mother called and states that patient hit her foot last night, the skin broke , and green pus came out.  She is worried and wants an urgent referral to podiatry. She is also asking if patient should go to ER and/or come in to be seen.  No fever and patient feels fine other than the pain in her foot. Cb#: [phone]

## 2018-01-07 ENCOUNTER — Encounter: Payer: Self-pay | Admitting: Family Medicine

## 2018-01-07 DIAGNOSIS — L600 Ingrowing nail: Secondary | ICD-10-CM | POA: Diagnosis not present

## 2018-01-07 DIAGNOSIS — M79675 Pain in left toe(s): Secondary | ICD-10-CM | POA: Diagnosis not present

## 2018-01-07 DIAGNOSIS — M79674 Pain in right toe(s): Secondary | ICD-10-CM | POA: Diagnosis not present

## 2018-02-28 ENCOUNTER — Ambulatory Visit: Payer: Self-pay | Admitting: Nurse Practitioner

## 2018-03-08 ENCOUNTER — Encounter: Payer: Self-pay | Admitting: Family Medicine

## 2018-05-28 ENCOUNTER — Encounter: Payer: Self-pay | Admitting: Gastroenterology

## 2018-05-28 ENCOUNTER — Ambulatory Visit: Payer: 59 | Admitting: Gastroenterology

## 2018-05-28 ENCOUNTER — Encounter

## 2018-05-28 VITALS — BP 110/73 | HR 74 | Temp 97.1°F | Ht 66.0 in | Wt 124.0 lb

## 2018-05-28 DIAGNOSIS — K580 Irritable bowel syndrome with diarrhea: Secondary | ICD-10-CM | POA: Diagnosis not present

## 2018-05-28 MED ORDER — DICYCLOMINE HCL 10 MG PO CAPS: ORAL_CAPSULE | ORAL | 11 refills | Status: DC

## 2018-05-28 NOTE — Patient Instructions (Signed)
1. Continue bentyl as before. Prescription sent for one year of refills.  2. Return to the office in two years or call sooner if needed.

## 2018-05-28 NOTE — Progress Notes (Signed)
cc'ed to pcp °

## 2018-05-28 NOTE — Progress Notes (Signed)
      Primary Care Physician: Aliene BeamsHagler, Rachel, MD  Primary Gastroenterologist:  Jonette EvaSandi Fields, MD   Chief Complaint  Patient presents with  . Irritable Bowel Syndrome    doing ok    HPI: Jennifer Potts is a 22 y.o. female here for follow up of IBS-D.  Doing very well.  Takes Bentyl about 2 times daily, typically before lunch and dinner.  Has a bowel movement or 2 per day.  No significant abdominal pain.  Appetite good.  No blood in the stool or melena.  Pleased with her progress.  No upper GI symptoms.  Fairly healthy eater.  Weight is up about 5 pounds from last year.  History of mildly elevated TTG IgG, negative TTG IgA, endomysial screen negative.  Likely insignificant findings. Never really completely eliminated gluten. Doesn't eat much gluten on regular basis at baseline.    Current Outpatient Medications  Medication Sig Dispense Refill  . dicyclomine (BENTYL) 10 MG capsule Take 1 capsule (10 mg total) by mouth 4 (four) times daily -  before meals and at bedtime. (Patient taking differently: Take 10 mg by mouth as needed. ) 120 capsule 5  . ibuprofen (ADVIL,MOTRIN) 200 MG tablet Take 200 mg by mouth every 6 (six) hours as needed.     No current facility-administered medications for this visit.     Allergies as of 05/28/2018  . (No Known Allergies)    ROS:  General: Negative for anorexia, weight loss, fever, chills, fatigue, weakness. ENT: Negative for hoarseness, difficulty swallowing , nasal congestion. CV: Negative for chest pain, angina, palpitations, dyspnea on exertion, peripheral edema.  Respiratory: Negative for dyspnea at rest, dyspnea on exertion, cough, sputum, wheezing.  GI: See history of present illness. GU:  Negative for dysuria, hematuria, urinary incontinence, urinary frequency, nocturnal urination.  Endo: Negative for unusual weight change.    Physical Examination:   BP 110/73   Pulse 74   Temp (!) 97.1 F (36.2 C) (Oral)   Ht 5\' 6"  (1.676 m)    Wt 124 lb (56.2 kg)   LMP 05/26/2018 (Exact Date)   BMI 20.01 kg/m   General: Well-nourished, well-developed in no acute distress.  Eyes: No icterus. Mouth: Oropharyngeal mucosa moist and pink , no lesions erythema or exudate. Lungs: Clear to auscultation bilaterally.  Heart: Regular rate and rhythm, no murmurs rubs or gallops.  Abdomen: Bowel sounds are normal, nontender, nondistended, no hepatosplenomegaly or masses, no abdominal bruits or hernia , no rebound or guarding.   Extremities: No lower extremity edema. No clubbing or deformities. Neuro: Alert and oriented x 4   Skin: Warm and dry, no jaundice.   Psych: Alert and cooperative, normal mood and affect.

## 2018-05-28 NOTE — Assessment & Plan Note (Signed)
Clinically does very well with use of bentyl twice daily. Continue current regimen. Return to the office in two years or sooner if needed.

## 2018-12-06 ENCOUNTER — Telehealth: Payer: Self-pay | Admitting: Gastroenterology

## 2018-12-06 DIAGNOSIS — R197 Diarrhea, unspecified: Secondary | ICD-10-CM

## 2018-12-06 NOTE — Telephone Encounter (Signed)
Please let patient know that I was following up on some information about Celiac disease and I would suggest she have labs for HLA typing for Celiac. As discussed at her previous OV, she had one serologies for Celiac that was minimally elevated and others were negative. I suspect she does not have Celiac but HLA typing could rule OUT celiac.   I have placed orders if patient is willing to have done. You may have to release.

## 2018-12-09 NOTE — Telephone Encounter (Signed)
LMOM for a return call. ( Lab order has been released).

## 2018-12-10 ENCOUNTER — Telehealth: Payer: Self-pay | Admitting: Gastroenterology

## 2018-12-10 NOTE — Telephone Encounter (Signed)
Pt left message with Darl Pikes to have [place] send message in [facility] CHART.  I have sent her the message.

## 2018-12-10 NOTE — Telephone Encounter (Signed)
I have sent the message in [facility] CHART.

## 2018-12-10 NOTE — Telephone Encounter (Signed)
Pt said she was returning a call from DS. I told her DS wasn't available at the moment. Pt said she was in training today with a new job and DS could send her a message via Mychart.

## 2018-12-10 NOTE — Telephone Encounter (Signed)
Left message for a return call

## 2018-12-13 NOTE — Telephone Encounter (Signed)
Pt responded in [facility] CHART and is doing well at this time. Does not choose to do testing and will let us know if anything changes.

## 2018-12-15 NOTE — Telephone Encounter (Signed)
noted 

## 2019-07-14 ENCOUNTER — Other Ambulatory Visit: Payer: Self-pay | Admitting: Gastroenterology

## 2019-12-01 ENCOUNTER — Ambulatory Visit
Admission: EM | Admit: 2019-12-01 | Discharge: 2019-12-01 | Disposition: A | Discharge status: Home / Self Care | Payer: 59 | Source: Home / Self Care

## 2019-12-01 ENCOUNTER — Other Ambulatory Visit: Payer: Self-pay

## 2019-12-01 ENCOUNTER — Emergency Department (HOSPITAL_COMMUNITY)
Admission: EM | Admit: 2019-12-01 | Discharge: 2019-12-01 | Disposition: A | Discharge status: Home / Self Care | Payer: 59 | Attending: Emergency Medicine | Admitting: Emergency Medicine

## 2019-12-01 ENCOUNTER — Encounter (HOSPITAL_COMMUNITY): Payer: Self-pay | Admitting: Emergency Medicine

## 2019-12-01 ENCOUNTER — Emergency Department (HOSPITAL_COMMUNITY): Payer: 59

## 2019-12-01 DIAGNOSIS — R233 Spontaneous ecchymoses: Secondary | ICD-10-CM | POA: Diagnosis not present

## 2019-12-01 DIAGNOSIS — M79604 Pain in right leg: Secondary | ICD-10-CM | POA: Diagnosis not present

## 2019-12-01 DIAGNOSIS — Z79899 Other long term (current) drug therapy: Secondary | ICD-10-CM | POA: Insufficient documentation

## 2019-12-01 DIAGNOSIS — R58 Hemorrhage, not elsewhere classified: Secondary | ICD-10-CM

## 2019-12-01 LAB — CBC WITH DIFFERENTIAL/PLATELET
Abs Immature Granulocytes: 0.02 10*3/uL (ref 0.00–0.07)
Basophils Absolute: 0.1 10*3/uL (ref 0.0–0.1)
Basophils Relative: 1 %
Eosinophils Absolute: 0.3 10*3/uL (ref 0.0–0.5)
Eosinophils Relative: 8 %
HCT: 44.6 % (ref 36.0–46.0)
Hemoglobin: 14.1 g/dL (ref 12.0–15.0)
Immature Granulocytes: 1 %
Lymphocytes Relative: 27 %
Lymphs Abs: 1 10*3/uL (ref 0.7–4.0)
MCH: 27.6 pg (ref 26.0–34.0)
MCHC: 31.6 g/dL (ref 30.0–36.0)
MCV: 87.5 fL (ref 80.0–100.0)
Monocytes Absolute: 0.5 10*3/uL (ref 0.1–1.0)
Monocytes Relative: 15 %
Neutro Abs: 1.7 10*3/uL (ref 1.7–7.7)
Neutrophils Relative %: 48 %
Platelets: 182 10*3/uL (ref 150–400)
RBC: 5.1 MIL/uL (ref 3.87–5.11)
RDW: 13 % (ref 11.5–15.5)
WBC: 3.5 10*3/uL — ABNORMAL LOW (ref 4.0–10.5)
nRBC: 0 % (ref 0.0–0.2)

## 2019-12-01 LAB — BASIC METABOLIC PANEL
Anion gap: 9 (ref 5–15)
BUN: 10 mg/dL (ref 6–20)
CO2: 26 mmol/L (ref 22–32)
Calcium: 9.5 mg/dL (ref 8.9–10.3)
Chloride: 103 mmol/L (ref 98–111)
Creatinine, Ser: 0.61 mg/dL (ref 0.44–1.00)
GFR calc Af Amer: >60 mL/min (ref >60)
GFR calc non Af Amer: >60 mL/min (ref >60)
Glucose, Bld: 85 mg/dL (ref 70–99)
Potassium: 4.2 mmol/L (ref 3.5–5.1)
Sodium: 138 mmol/L (ref 135–145)

## 2019-12-01 NOTE — Discharge Instructions (Signed)
Ice and Tylenol as needed.  Return to emergency department for any new worsening symptoms

## 2019-12-01 NOTE — ED Triage Notes (Signed)
Pt noted a bruised/discolored area to back of her right leg and is concerned about blood clots.

## 2019-12-01 NOTE — ED Provider Notes (Signed)
[facility] EMERGENCY DEPARTMENT Provider Note   CSN: [id number] Arrival date & time: 12/01/19  0915    History Chief Complaint  Patient presents with  . Bleeding/Bruising    Jennifer Potts is a 24 y.o. female with past medical history significant for IBS who presents for evaluation of bruising.  Patient states she noticed a bruised area to the posterior right calf last night. She is concerned for DVT. No prior hx of DVT, PE, body disorders, exogenous hormone use, recent surgery, immobilization, malignancy.  Has had some generalized aching but denies any edema, erythema or warmth.  No chest pain, shortness of breath or weakness.  She did get her Covid vaccine less than 1 week ago.  She is unsure if this is related.  Denies fever, chills, nausea, vomiting, chest pain, shortness of breath abdominal pain, diarrhea, dysuria, paresthesias, decreased range of motion.  Denies additional aggravating or alleviating factors.  History obtained from patient and past medical records.  No interpreter is used.  HPI     Past Medical History:  Diagnosis Date  . IBS (irritable bowel syndrome)    Diarrhea type    Patient Active Problem List   Diagnosis Date Noted  . Irritable bowel syndrome with diarrhea 01/10/2017    Past Surgical History:  Procedure Laterality Date  . WISDOM TOOTH EXTRACTION       OB History   No obstetric history on file.     Family History  Problem Relation Age of Onset  . Diabetes Maternal Grandfather   . Diabetes Paternal Grandmother   . Colon cancer Paternal Grandmother   . Cancer Paternal Grandfather        "abdominal"    Social History   Tobacco Use  . Smoking status: Never Smoker  . Smokeless tobacco: Never Used  Substance Use Topics  . Alcohol use: No  . Drug use: No    Home Medications Prior to Admission medications   Medication Sig Start Date End Date Taking? Authorizing Provider  colestipol (COLESTID) 1 g tablet Take 1 g by mouth daily.   10/20/19  Yes [provider]  dicyclomine (BENTYL) 10 MG capsule TAKE 1 CAPSULES BY MOUTH UP TO FOUR TIMES DAILY BEFORE MEALS AND EVERY NIGHT AT BEDTIME FOR DIARRHEA/ABDOMINAL CRAMPS Patient taking differently: Take 10 mg by mouth 4 (four) times daily as needed. TAKE 1 CAPSULES BY MOUTH UP TO FOUR TIMES DAILY BEFORE MEALS AND EVERY NIGHT AT BEDTIME FOR DIARRHEA/ABDOMINAL CRAMPS 07/16/19  Yes Letta Median, PA-C  ibuprofen (ADVIL,MOTRIN) 200 MG tablet Take 200 mg by mouth every 6 (six) hours as needed.   Yes [provider]    Allergies    Patient has no known allergies.  Review of Systems   Review of Systems  Constitutional: Negative.   HENT: Negative.   Eyes: Negative.   Respiratory: Negative.   Cardiovascular: Negative.   Gastrointestinal: Negative.   Musculoskeletal: Negative.        Right calf bruising  Skin: Positive for wound.  Neurological: Negative.   All other systems reviewed and are negative.   Physical Exam Updated Vital Signs BP 114/83 (BP Location: Right Arm)   Pulse 80   Temp 98.5 F (36.9 C) (Oral)   Resp 18   Ht 5\' 5"  (1.651 m)   Wt 56.7 kg   LMP 11/03/2019   SpO2 100%   BMI 20.80 kg/m   Physical Exam Vitals and nursing note reviewed.  Constitutional:      General:  She is not in acute distress.    Appearance: She is well-developed. She is not ill-appearing or toxic-appearing.  HENT:     Head: Normocephalic and atraumatic.     Nose: Nose normal.     Mouth/Throat:     Mouth: Mucous membranes are moist.     Pharynx: Oropharynx is clear.  Eyes:     Pupils: Pupils are equal, round, and reactive to light.  Cardiovascular:     Rate and Rhythm: Normal rate.     Pulses: Normal pulses.     Heart sounds: Normal heart sounds.  Pulmonary:     Effort: Pulmonary effort is normal. No respiratory distress.     Breath sounds: Normal breath sounds.  Abdominal:     General: Bowel sounds are normal. There is no distension.     Tenderness:  There is no abdominal tenderness. There is no right CVA tenderness, left CVA tenderness or guarding.  Musculoskeletal:        General: No swelling, tenderness, deformity or signs of injury. Normal range of motion.     Cervical back: Normal range of motion.     Right lower leg: No edema.     Left lower leg: No edema.     Comments: Compartments soft.  No bony tenderness, edema.  Bevelyn Buckles' sign negative full range of motion bilateral lower extremities without difficulty.  Wiggles toes.  Skin:    General: Skin is warm and dry.     Capillary Refill: Capillary refill takes less than 2 seconds.     Comments: 4 cm area of ecchymosis to right posterior calf.  No fluctuance, induration.  No edema, erythema or warmth  Neurological:     Mental Status: She is alert.     Comments: Moves bilateral lower extremities slight difficulty.  Ambulatory.  Intact sensation     ED Results / Procedures / Treatments   Labs (all labs ordered are listed, but only abnormal results are displayed) Labs Reviewed  CBC WITH DIFFERENTIAL/PLATELET - Abnormal; Notable for the following components:      Result Value   WBC 3.5 (*)    All other components within normal limits  BASIC METABOLIC PANEL    EKG None  Radiology US Venous Img Lower Unilateral Right  Result Date: 12/01/2019 CLINICAL DATA:  24 year old female with right lower extremity pain for 1 day EXAM: RIGHT LOWER EXTREMITY VENOUS DOPPLER ULTRASOUND TECHNIQUE: Gray-scale sonography with graded compression, as well as color Doppler and duplex ultrasound were performed to evaluate the lower extremity deep venous systems from the level of the common femoral vein and including the common femoral, femoral, profunda femoral, popliteal and calf veins including the posterior tibial, peroneal and gastrocnemius veins when visible. The superficial great saphenous vein was also interrogated. Spectral Doppler was utilized to evaluate flow at rest and with distal augmentation  maneuvers in the common femoral, femoral and popliteal veins. COMPARISON:  None. FINDINGS: Contralateral Common Femoral Vein: Respiratory phasicity is normal and symmetric with the symptomatic side. No evidence of thrombus. Normal compressibility. Common Femoral Vein: No evidence of thrombus. Normal compressibility, respiratory phasicity and response to augmentation. Saphenofemoral Junction: No evidence of thrombus. Normal compressibility and flow on color Doppler imaging. Profunda Femoral Vein: No evidence of thrombus. Normal compressibility and flow on color Doppler imaging. Femoral Vein: No evidence of thrombus. Normal compressibility, respiratory phasicity and response to augmentation. Popliteal Vein: No evidence of thrombus. Normal compressibility, respiratory phasicity and response to augmentation. Calf Veins: No evidence of thrombus. Normal  compressibility and flow on color Doppler imaging. Superficial Great Saphenous Vein: No evidence of thrombus. Normal compressibility. Venous Reflux:  None. Other Findings: In the region of clinical concern in the posterior aspect of the mid calf, there is an ill-defined region of increased echogenicity within the subcutaneous superficial adipose tissue. No fluid or mass. IMPRESSION: 1. No evidence of deep venous thrombosis. 2. In the region of clinical concern in the posterior aspect of the left mid calf there is a ill-defined region of increased echogenicity within the superficial subcutaneous adipose tissue. This may represent a small contusion or region of fat necrosis-does the patient have a recent history of trauma to this region? Electronically Signed   By: Malachy Moan M.D.   On: 12/01/2019 10:07    Procedures Procedures (including critical care time)  Medications Ordered in ED Medications - No data to display  ED Course  I have reviewed the triage vital signs and the nursing notes.  Pertinent labs & imaging results that were available during my  care of the patient were reviewed by me and considered in my medical decision making (see chart for details).   7 old female peers otherwise well presents for evaluation of bruising to right calf.  She is afebrile, nonseptic, non-ill-appearing.  No risk factors for DVT, PE.  Denies any chest pain, shortness of breath.  She has no tachycardia, tachypnea or hypoxia.  She is neurovascularly intact.  She has neuromusculoskeletal exam.  Her compartments are soft.  Denna Haggard' sign negative.  She does have a 3 cm - 4 cm area of ecchymosis to her right posterior calf.  No surrounding edema, erythema or warmth.  Bruising will obtain labs to assess for anemia.  Also obtain ultrasound to rule out DVT.  Ultrasound negative for DVT however they do note possible bruising likely due to trauma.  Her compartments are soft.  Low suspicion for acute vascular, event, tendon, musculoskeletal, infectious etiology.  Labs without any significant abnormality.  Question whether patient unknowingly had injured leg this is really bruising from trauma.  Discussed ice for symptomatic management use follow with PCP for reevaluation.  The patient has been appropriately medically screened and/or stabilized in the ED. I have low suspicion for any other emergent medical condition which would require further screening, evaluation or treatment in the ED or require inpatient management.  Patient is hemodynamically stable and in no acute distress.  Patient able to ambulate in department prior to ED.  Evaluation does not show acute pathology that would require ongoing or additional emergent interventions while in the emergency department or further inpatient treatment.  I have discussed the diagnosis with the patient and answered all questions.  Pain is been managed while in the emergency department and patient has no further complaints prior to discharge.  Patient is comfortable with plan discussed in room and is stable for discharge at this  time.  I have discussed strict return precautions for returning to the emergency department.  Patient was encouraged to follow-up with PCP/specialist refer to at discharge.    MDM Rules/Calculators/A&P                       Final Clinical Impression(s) / ED Diagnoses Final diagnoses:  Pain of right lower extremity  Ecchymosis    Rx / DC Orders ED Discharge Orders    None       [place] A, PA-C 12/01/19 1135    Bethann Berkshire, MD 12/01/19 1344

## 2020-03-09 ENCOUNTER — Encounter: Payer: Self-pay | Admitting: Gastroenterology

## 2020-10-12 DIAGNOSIS — Z03818 Encounter for observation for suspected exposure to other biological agents ruled out: Secondary | ICD-10-CM | POA: Diagnosis not present

## 2020-10-12 DIAGNOSIS — Z20822 Contact with and (suspected) exposure to covid-19: Secondary | ICD-10-CM | POA: Diagnosis not present

## 2020-12-09 DIAGNOSIS — K580 Irritable bowel syndrome with diarrhea: Secondary | ICD-10-CM | POA: Diagnosis not present

## 2021-01-09 IMAGING — US US EXTREM LOW VENOUS*R*
1 series · 13 of 24 positions shown · non-contrast
Comparison: None.

CLINICAL DATA: 23-year-old female with right lower extremity pain
for 1 day



[Series 1: us extrem low venous*right* · 13 of 41 slices shown]
[im 1/41]
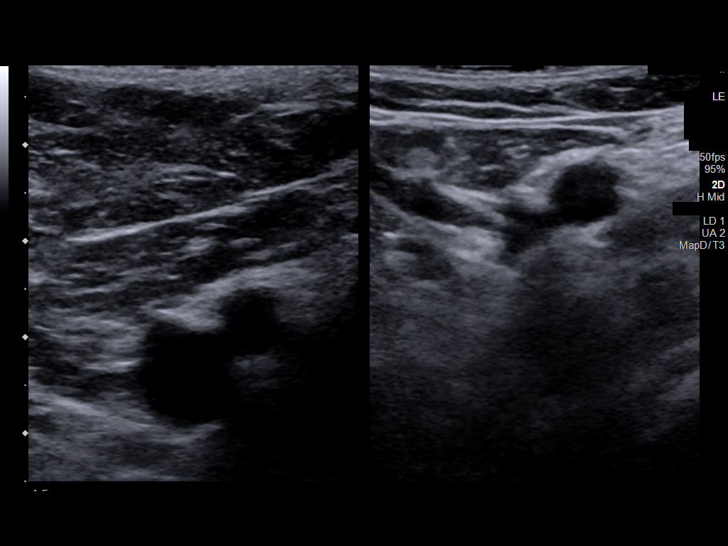
[im 4/41]
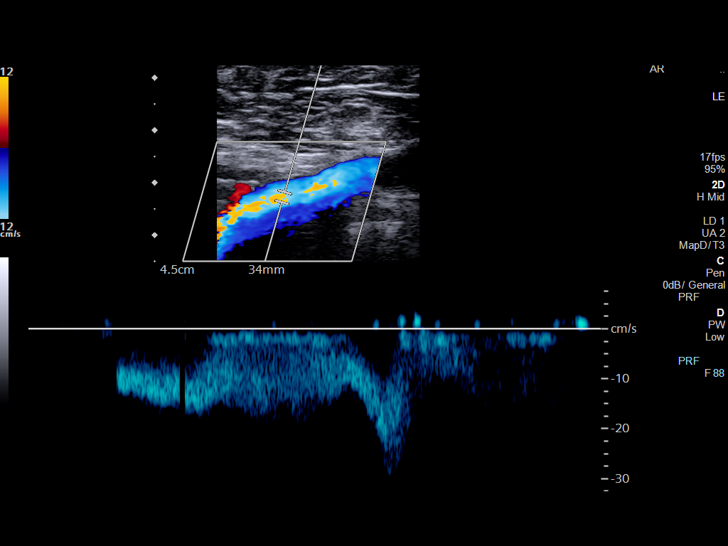
[im 7/41]
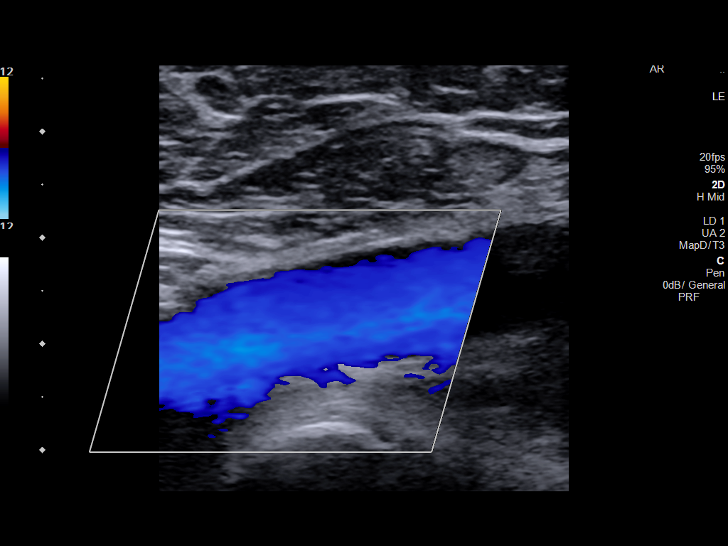
[im 11/41]
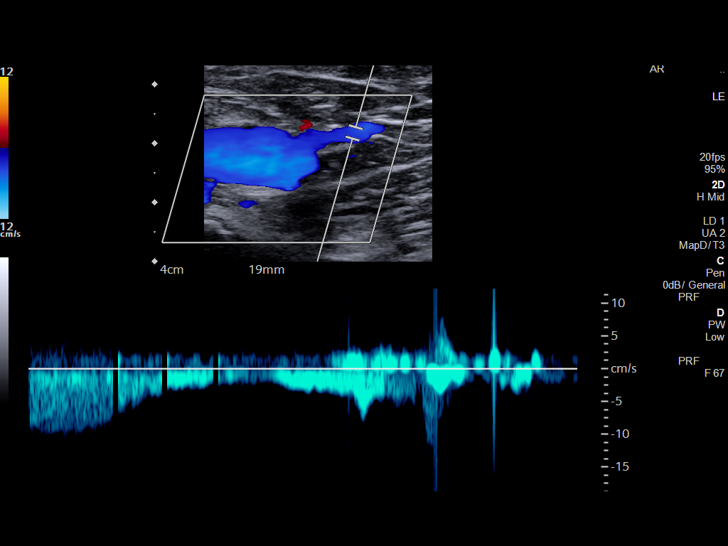
[im 14/41]
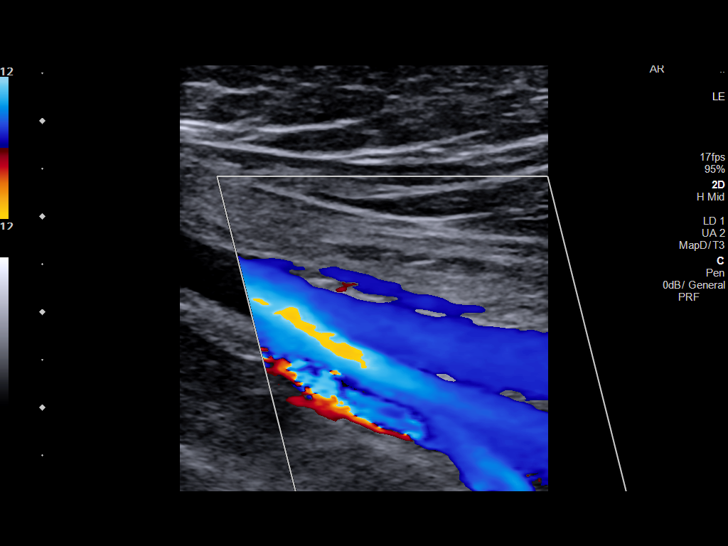
[im 18/41]
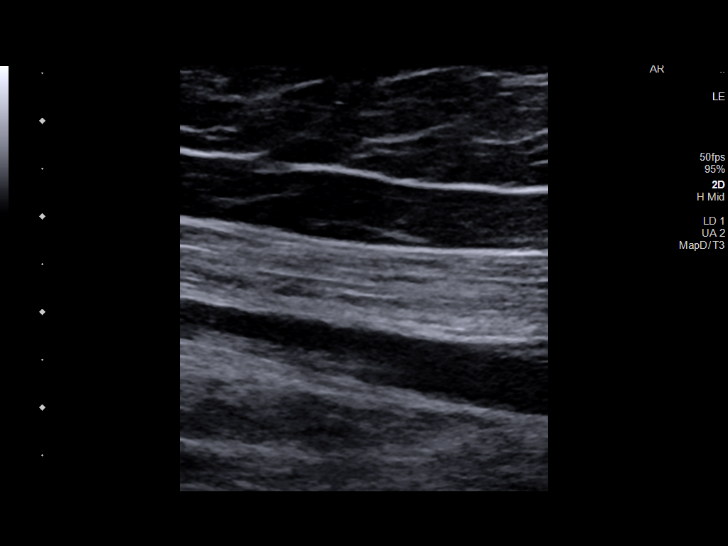
[im 21/41]
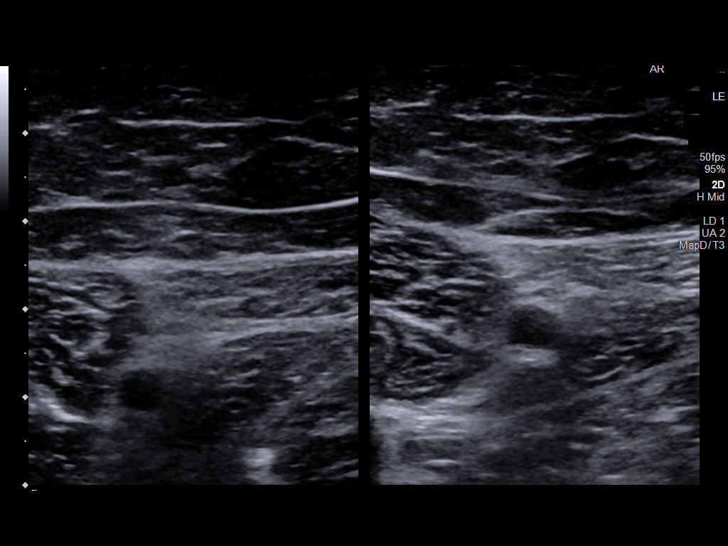
[im 23/41]
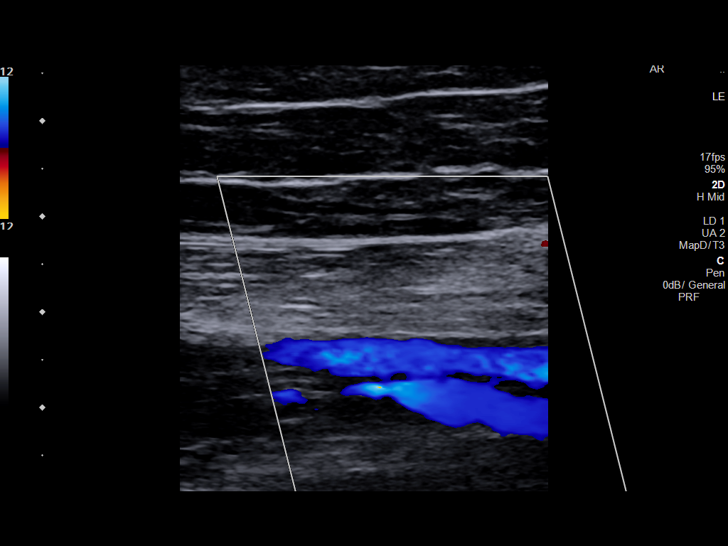
[im 27/41]
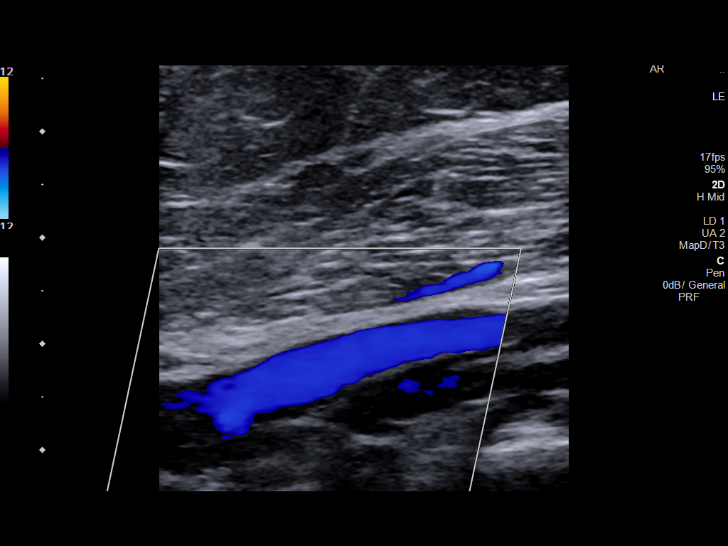
[im 30/41]
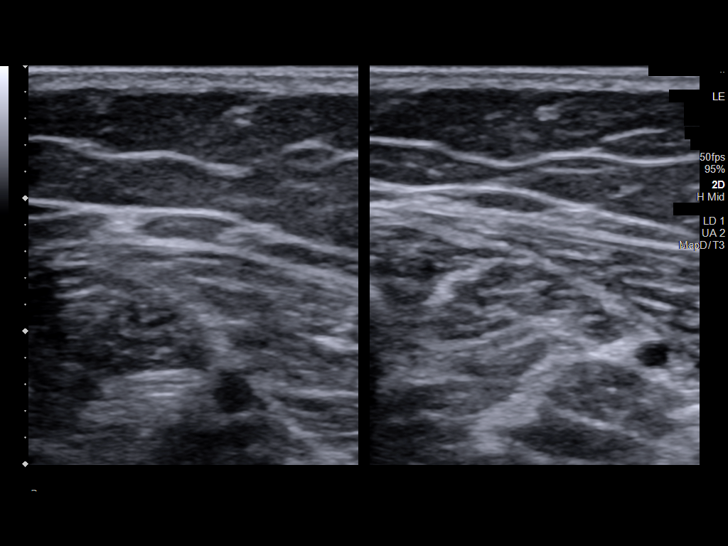
[im 34/41]
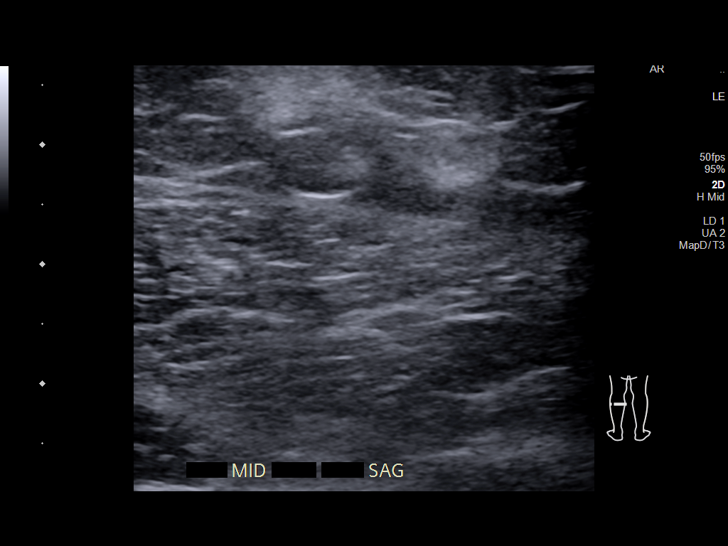
[im 37/41]
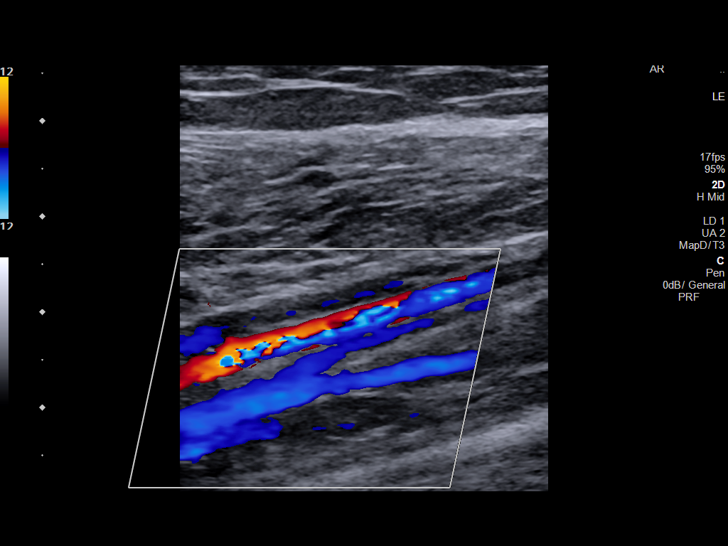
[im 41/41]
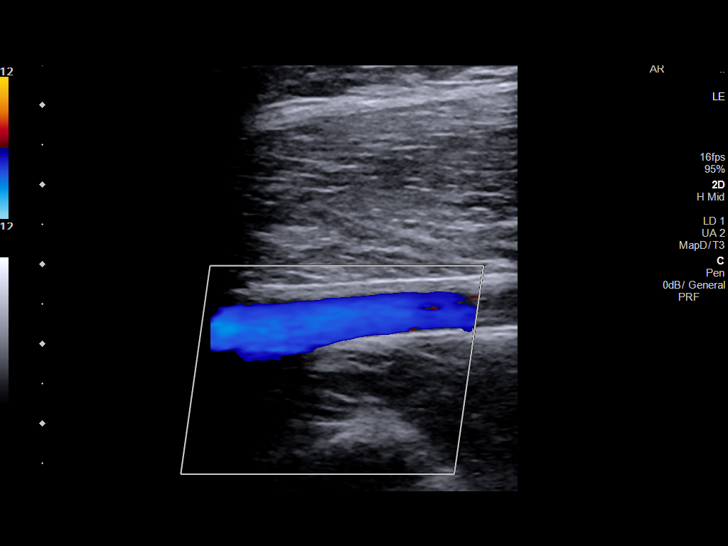

[13 of 24 positions shown; findings below may reference images not displayed]

FINDINGS: Contralateral Common Femoral Vein: Respiratory phasicity is normal
and symmetric with the symptomatic side. No evidence of thrombus.
Normal compressibility.

Common Femoral Vein: No evidence of thrombus. Normal
compressibility, respiratory phasicity and response to augmentation.

Saphenofemoral Junction: No evidence of thrombus. Normal
compressibility and flow on color Doppler imaging.

Profunda Femoral Vein: No evidence of thrombus. Normal
compressibility and flow on color Doppler imaging.

Femoral Vein: No evidence of thrombus. Normal compressibility,
respiratory phasicity and response to augmentation.

Popliteal Vein: No evidence of thrombus. Normal compressibility,
respiratory phasicity and response to augmentation.

Calf Veins: No evidence of thrombus. Normal compressibility and flow
on color Doppler imaging.

Superficial Great Saphenous Vein: No evidence of thrombus. Normal
compressibility.

Venous Reflux:  None.

Other Findings: In the region of clinical concern in the posterior
aspect of the mid calf, there is an ill-defined region of increased
echogenicity within the subcutaneous superficial adipose tissue. No
fluid or mass.
IMPRESSION: 1. No evidence of deep venous thrombosis.
2. In the region of clinical concern in the posterior aspect of the
left mid calf there is a ill-defined region of increased
echogenicity within the superficial subcutaneous adipose tissue.
This may represent a small contusion or region of fat necrosis-does
the patient have a recent history of trauma to this region?

## 2023-01-25 ENCOUNTER — Encounter: Payer: Self-pay | Admitting: Nurse Practitioner

## 2023-03-05 ENCOUNTER — Encounter: Payer: Self-pay | Admitting: Nurse Practitioner

## 2023-03-05 ENCOUNTER — Ambulatory Visit: Payer: No Typology Code available for payment source | Admitting: Nurse Practitioner

## 2023-03-05 VITALS — BP 99/64 | HR 80 | Ht 65.0 in | Wt 127.8 lb

## 2023-03-05 DIAGNOSIS — E162 Hypoglycemia, unspecified: Secondary | ICD-10-CM

## 2023-03-05 NOTE — Progress Notes (Signed)
Endocrinology Consult Note                                            03/05/2023, 11:34 AM   Subjective:    Patient ID: Jennifer Potts, female    DOB: 1996-05-18, PCP Patient, No Pcp Per   Past Medical History:  Diagnosis Date   IBS (irritable bowel syndrome)    Diarrhea type   Past Surgical History:  Procedure Laterality Date   WISDOM TOOTH EXTRACTION     Social History   Socioeconomic History   Marital status: Single    Spouse name: Not on file   Number of children: Not on file   Years of education: 14   Highest education level: Not on file  Occupational History   Occupation: student  Tobacco Use   Smoking status: Never   Smokeless tobacco: Never  Substance and Sexual Activity   Alcohol use: No   Drug use: No   Sexual activity: Yes    Birth control/protection: None  Other Topics Concern   Not on file  Social History Narrative   Lives at home   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   colestipol (COLESTID) 1 g tablet Take 1 g by mouth daily.    ibuprofen (ADVIL,MOTRIN) 200 MG tablet Take 200 mg by mouth every 6 (six) hours as needed.   [DISCONTINUED] dicyclomine (BENTYL) 10 MG capsule TAKE 1 CAPSULES BY MOUTH UP TO FOUR TIMES DAILY BEFORE MEALS AND EVERY NIGHT AT BEDTIME FOR DIARRHEA/ABDOMINAL CRAMPS (Patient not taking: Reported on 03/05/2023)   No facility-administered  encounter medications on file as of 03/05/2023.   ALLERGIES: No Known Allergies  VACCINATION STATUS:  There is no immunization history on file for this patient.  HPI Jennifer Potts is 27 y.o. female who presents today with a medical history as above. she is being seen in consultation for hypoglycemia requested by Patient, No Pcp Per.  she has been dealing with symptoms of  palpitations and brain fog when glucose drops low following a meal.  This has been going on/off for a few years now.  She has worn a CGM and notes her glucose overnight has been as low as 49.  She did recheck fingerstick with that reading and found her number was 20ish points higher.  She has strong family history of diabetes in her family.  Her Dads side mostly type 2 diabetes, moms side has type 1 in their history.  She also notes her mom has similar issues with hypoglycemia, has undergone all the work up and did not have pancreatic adenoma.  She does not exercise on any routine pattern.  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  She eats 3 meals per day and snacks between to prevent hypoglycemia.  She typically eats an egg, waffle (no syrup) and OJ or tea for breakfast, a snack around 10 am at work, then lunch and supper between 6-7 pm.  She does not typically eat snacks at night.  Review of systems  Constitutional: + Minimally fluctuating body weight,  current Body mass index is 21.27 kg/m. , no fatigue, no subjective hyperthermia, no subjective hypothermia Eyes: no blurry vision, no xerophthalmia ENT: no sore throat, no nodules palpated in throat, no dysphagia/odynophagia, no hoarseness Cardiovascular: no chest pain, no shortness of breath, no palpitations, no leg swelling Respiratory: no cough, no shortness of breath Gastrointestinal: no nausea/vomiting/diarrhea- has hx of IBS-D controlled on meds Musculoskeletal: no muscle/joint aches Skin: no rashes, no hyperemia Neurological: no  tremors, no numbness, no tingling, no dizziness Psychiatric: no depression, no anxiety   Objective:    BP 99/64 (BP Location: Left Arm, Patient Position: Sitting, Cuff Size: Normal)   Pulse 80   Ht 5\' 5"  (1.651 m)   Wt 127 lb 12.8 oz (58 kg)   BMI 21.27 kg/m   Wt Readings from Last 3 Encounters:  03/05/23 127 lb 12.8 oz (58 kg)  12/01/19 125 lb (56.7 kg)  05/28/18 124 lb (56.2 kg)    BP Readings from Last 3 Encounters:  03/05/23 99/64  12/01/19 120/70  05/28/18 110/73     Physical Exam- Limited  Constitutional:  Body mass index is 21.27 kg/m. , not in acute distress, normal state of mind Eyes:  EOMI, no exophthalmos Neck: Supple Cardiovascular: RRR, no murmurs, rubs, or gallops, no edema Respiratory: Adequate breathing efforts, no crackles, rales, rhonchi, or wheezing Musculoskeletal: no gross deformities, strength intact in all four extremities, no gross restriction of joint movements Skin:  no rashes, no hyperemia Neurological: no tremor with outstretched hands   CMP ( most recent) CMP     Component Value Date/Time   NA 138 12/01/2019 1043   K 4.2 12/01/2019 1043   CL 103 12/01/2019 1043   CO2 26 12/01/2019 1043   GLUCOSE 85 12/01/2019 1043   BUN 10 12/01/2019 1043   CREATININE 0.61 12/01/2019 1043   CREATININE 0.75 01/10/2017 0907   CALCIUM 9.5 12/01/2019 1043   PROT 7.7 01/10/2017 0907   ALBUMIN 4.9 01/10/2017 0907   AST 18 01/10/2017 0907   ALT 15 01/10/2017 0907   ALKPHOS 45 01/10/2017 0907   BILITOT 0.4 01/10/2017 0907   GFRNONAA >60 12/01/2019 1043   GFRNONAA >89 01/10/2017 0907   GFRAA >60 12/01/2019 1043   GFRAA >89 01/10/2017 0907     Diabetic Labs (most recent): No results found for: "HGBA1C", "MICROALBUR"   Lipid Panel ( most recent) Lipid Panel  No results found for: "CHOL", "TRIG", "HDL", "CHOLHDL", "VLDL", "LDLCALC", "LDLDIRECT", "LABVLDL"    No results found for: "TSH", "FREET4"         Assessment & Plan:   1)  Hypoglycemia- unspecified  - Jennifer Potts  is being seen at a kind request of Patient, No Pcp Per.  - I have reviewed her available records and clinically evaluated the patient.  - Based on these reviews, she has reactive hypoglycemia given the timing and circumstances surrounding her episodes, however, there is not sufficient information to proceed with definitive treatment plan.   -I did give her another CGM to wear today and encouraged her to keep a detailed food journal while wearing it.  We also discussed healthy diet and benefits of WFPB lifestyle.   The following Lifestyle Medicine recommendations according to American College of Lifestyle Medicine [facility]) were discussed and offered to patient and she agrees to start the journey:  A. Whole Foods, Plant-based plate comprising of fruits and vegetables, plant-based proteins, whole-grain carbohydrates was discussed in detail with the patient.   A list for source of those nutrients were also provided to the patient.  Patient will use only water or unsweetened tea for hydration. B.  The need to stay away from risky substances including alcohol, smoking; obtaining 7 to 9 hours of restorative sleep, at least 150 minutes of moderate intensity exercise weekly, the importance of healthy social connections,  and stress reduction techniques were discussed. C.  A full color page of  Calorie density of various food groups per pound showing examples of each food groups was provided to the patient.  - The patient admits there is a room for improvement in their diet and drink choices. -  Suggestion is made for the patient to avoid simple carbohydrates from their diet including Cakes, Sweet Desserts / Pastries, Ice Cream, Soda (diet and regular), Sweet Tea, Candies, Chips, Cookies, Sweet Pastries, Store Bought Juices, Alcohol in Excess of 1-2 drinks a day, Artificial Sweeteners, Coffee Creamer, and "Sugar-free" Products. This will help patient to have  stable blood glucose profile and potentially avoid unintended weight gain.   - I encouraged the patient to switch to unprocessed or minimally processed complex starch and increased protein intake (animal or plant source), fruits, and vegetables.   - Patient is advised to stick to a routine mealtimes to eat 3 meals a day and avoid unnecessary snacks (to snack only to correct hypoglycemia).  I will check insulin and cpeptide levels, and A1c as well as repeating her CMP to help rule out other potential causes of hypoglycemia.  - I did not initiate any new prescriptions today.  - she is advised to maintain close follow up with Patient, No Pcp Per for primary care needs.   - Time spent with the patient: 45 minutes, of which >50% was spent in  counseling her about her hypoglycemia and the rest in obtaining information about her symptoms, reviewing her previous labs/studies (including abstractions from other facilities),  evaluations, and treatments,  and developing a plan to confirm diagnosis and long term treatment based on the latest standards of care/guidelines; and documenting her care.  Jennifer Potts participated in the discussions, expressed understanding, and voiced agreement with the above plans.  All questions were answered to her satisfaction. she is encouraged to contact clinic should she have any questions or concerns prior to her return visit.  Follow up plan: Return in about 1 month (around 04/04/2023) for hypoglycemia follow up with previsit labs.   Ronny Bacon, [facility] [address] [place], [place] [address] Phone: [phone] Fax: [phone]   03/05/2023, 11:34 AM

## 2023-04-05 LAB — COMPREHENSIVE METABOLIC PANEL
ALT: 16 IU/L (ref 0–32)
AST: 17 IU/L (ref 0–40)
Albumin: 4.7 g/dL (ref 4.0–5.0)
Alkaline Phosphatase: 55 IU/L (ref 44–121)
BUN/Creatinine Ratio: 13 (ref 9–23)
BUN: 9 mg/dL (ref 6–20)
Bilirubin Total: 0.3 mg/dL (ref 0.0–1.2)
CO2: 21 mmol/L (ref 20–29)
Calcium: 9.2 mg/dL (ref 8.7–10.2)
Chloride: 104 mmol/L (ref 96–106)
Creatinine, Ser: 0.69 mg/dL (ref 0.57–1.00)
Globulin, Total: 2.4 g/dL (ref 1.5–4.5)
Glucose: 76 mg/dL (ref 70–99)
Potassium: 4.3 mmol/L (ref 3.5–5.2)
Sodium: 140 mmol/L (ref 134–144)
Total Protein: 7.1 g/dL (ref 6.0–8.5)
eGFR: 122 mL/min/{1.73_m2} (ref >59)

## 2023-04-05 LAB — HEMOGLOBIN A1C
Est. average glucose Bld gHb Est-mCnc: 100 mg/dL
Hgb A1c MFr Bld: 5.1 % (ref 4.8–5.6)

## 2023-04-05 LAB — INSULIN AND C-PEPTIDE, SERUM
C-Peptide: 1.8 ng/mL (ref 1.1–4.4)
INSULIN: 5.2 u[IU]/mL (ref 2.6–24.9)

## 2023-04-11 ENCOUNTER — Encounter: Payer: Self-pay | Admitting: Nurse Practitioner

## 2023-04-11 ENCOUNTER — Ambulatory Visit: Payer: No Typology Code available for payment source | Admitting: Nurse Practitioner

## 2023-04-11 VITALS — BP 95/62 | HR 78 | Ht 65.0 in | Wt 127.0 lb

## 2023-04-11 DIAGNOSIS — E162 Hypoglycemia, unspecified: Secondary | ICD-10-CM

## 2023-04-11 NOTE — Progress Notes (Signed)
Endocrinology Follow Up Note                                            04/11/2023, 9:45 AM   Subjective:    Patient ID: Jennifer Potts, female    DOB: 29-Dec-1995, PCP Patient, No Pcp Per   Past Medical History:  Diagnosis Date   IBS (irritable bowel syndrome)    Diarrhea type   Past Surgical History:  Procedure Laterality Date   WISDOM TOOTH EXTRACTION     Social History   Socioeconomic History   Marital status: Single    Spouse name: Not on file   Number of children: Not on file   Years of education: 14   Highest education level: Not on file  Occupational History   Occupation: student  Tobacco Use   Smoking status: Never   Smokeless tobacco: Never  Substance and Sexual Activity   Alcohol use: No   Drug use: No   Sexual activity: Yes    Birth control/protection: None  Other Topics Concern   Not on file  Social History Narrative   Lives at home   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   colestipol (COLESTID) 1 g tablet Take 1 g by mouth daily.    ibuprofen (ADVIL,MOTRIN) 200 MG tablet Take 200 mg by mouth every 6 (six) hours as needed.   No facility-administered encounter medications on file as of 04/11/2023.   ALLERGIES: No Known Allergies  VACCINATION STATUS:  There is no immunization history on file for this patient.  HPI Jennifer Potts is 27 y.o. female  who presents today with a medical history as above. she is being seen in follow up after being seen in consultation for hypoglycemia requested by Patient, No Pcp Per.  she has been dealing with symptoms of  palpitations and brain fog when glucose drops low following a meal.  This has been going on/off for a few years now.  She has worn a CGM and notes her glucose overnight has been as low as 49.  She did recheck fingerstick with that reading and found her number was 20ish points higher.  She has strong family history of diabetes in her family.  Her Dads side mostly type 2 diabetes, moms side has type 1 in their history.  She also notes her mom has similar issues with hypoglycemia, has undergone all the work up and did not have pancreatic adenoma.  She does not exercise on any routine pattern.  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  She eats 3 meals per day and snacks between to prevent hypoglycemia.  She typically eats an egg, waffle (no  syrup) and OJ or tea for breakfast, a snack around 10 am at work, then lunch and supper between 6-7 pm.  She does not typically eat snacks at night.  04/11/23- Patient wore Dexcom G7 for 10 days and found it really informational.  She could see which foods trigger higher spikes in glucose (mainly processed foods) which then resulted in drops in glucose.  She has focused more on adding protein and limiting simple starches.  She has not had any significant hypoglycemia since last visit.  Review of systems  Constitutional: + Minimally fluctuating body weight,  current Body mass index is 21.13 kg/m. , no fatigue, no subjective hyperthermia, no subjective hypothermia Eyes: no blurry vision, no xerophthalmia ENT: no sore throat, no nodules palpated in throat, no dysphagia/odynophagia, no hoarseness Cardiovascular: no chest pain, no shortness of breath, no palpitations, no leg swelling Respiratory: no cough, no shortness of  breath Gastrointestinal: no nausea/vomiting/diarrhea- has hx of IBS-D controlled on meds Musculoskeletal: no muscle/joint aches Skin: no rashes, no hyperemia Neurological: no tremors, no numbness, no tingling, no dizziness Psychiatric: no depression, no anxiety   Objective:    BP 95/62 (BP Location: Left Arm, Patient Position: Sitting, Cuff Size: Normal)   Pulse 78   Ht 5\' 5"  (1.651 m)   Wt 127 lb (57.6 kg)   BMI 21.13 kg/m   Wt Readings from Last 3 Encounters:  04/11/23 127 lb (57.6 kg)  03/05/23 127 lb 12.8 oz (58 kg)  12/01/19 125 lb (56.7 kg)    BP Readings from Last 3 Encounters:  04/11/23 95/62  03/05/23 99/64  12/01/19 120/70     Physical Exam- Limited  Constitutional:  Body mass index is 21.13 kg/m. , not in acute distress, normal state of mind Eyes:  EOMI, no exophthalmos Musculoskeletal: no gross deformities, strength intact in all four extremities, no gross restriction of joint movements Skin:  no rashes, no hyperemia Neurological: no tremor with outstretched hands   CMP ( most recent) CMP     Component Value Date/Time   NA 140 04/04/2023 0747   K 4.3 04/04/2023 0747   CL 104 04/04/2023 0747   CO2 21 04/04/2023 0747   GLUCOSE 76 04/04/2023 0747   GLUCOSE 85 12/01/2019 1043   BUN 9 04/04/2023 0747   CREATININE 0.69 04/04/2023 0747   CREATININE 0.75 01/10/2017 0907   CALCIUM 9.2 04/04/2023 0747   PROT 7.1 04/04/2023 0747   ALBUMIN 4.7 04/04/2023 0747   AST 17 04/04/2023 0747   ALT 16 04/04/2023 0747   ALKPHOS 55 04/04/2023 0747   BILITOT 0.3 04/04/2023 0747   GFRNONAA >60 12/01/2019 1043   GFRNONAA >89 01/10/2017 0907   GFRAA >60 12/01/2019 1043   GFRAA >89 01/10/2017 0907     Diabetic Labs (most recent): Lab Results  Component Value Date   HGBA1C 5.1 04/04/2023     Lipid Panel ( most recent) Lipid Panel  No results found for: "CHOL", "TRIG", "HDL", "CHOLHDL", "VLDL", "LDLCALC", "LDLDIRECT", "LABVLDL"    No results found for: "TSH",  "FREET4"       Latest Reference Range & Units 04/04/23 07:47  COMPREHENSIVE METABOLIC PANEL  Rpt  Sodium 134 - 144 mmol/L 140  Potassium 3.5 - 5.2 mmol/L 4.3  Chloride 96 - 106 mmol/L 104  CO2 20 - 29 mmol/L 21  Glucose 70 - 99 mg/dL 76  BUN 6 - 20 mg/dL 9  Creatinine 1.61 - 0.96 mg/dL 0.45  Calcium 8.7 - 40.9 mg/dL 9.2  BUN/Creatinine Ratio 9 -  23  13  eGFR >59 mL/min/1.73 122  Alkaline Phosphatase 44 - 121 IU/L 55  Albumin 4.0 - 5.0 g/dL 4.7  AST 0 - 40 IU/L 17  ALT 0 - 32 IU/L 16  Total Protein 6.0 - 8.5 g/dL 7.1  Total Bilirubin 0.0 - 1.2 mg/dL 0.3  Globulin, Total 1.5 - 4.5 g/dL 2.4  Hemoglobin Z6X 4.8 - 5.6 % 5.1  Est. average glucose Bld gHb Est-mCnc mg/dL 096  C-Peptide 1.1 - 4.4 ng/mL 1.8  INSULIN 2.6 - 24.9 uIU/mL 5.2  Rpt: View report in Results Review for more information   Assessment & Plan:   1) Hypoglycemia- reactive  - [place] S Wooden  is being seen at a kind request of Patient, No Pcp Per.  - I have reviewed her available records and clinically evaluated the patient.  - Based on these reviews, she has reactive hypoglycemia given the timing and circumstances surrounding her episodes.  Her HOMA- IR score was 0.9758 indicating she is sensitive to insulin.  Insulin and c-peptide levels were normal indicating normal insulin production.  A1c was 5.1%, normal, all supporting her reactive hypoglycemia diagnosis.  Given the resolution of her symptoms with lifestyle changes, she will not need routine follow up here.  She can schedule another appt if her symptoms return.  I did inform her of a CGM what will be coming out available OTC next month Nyoka Lint).  She may be interested in using this to continue to learn her body metabolism.   The following Lifestyle Medicine recommendations according to American College of Lifestyle Medicine [facility]) were discussed and offered to patient and she agrees to start the journey:  A. Whole Foods, Plant-based plate comprising of  fruits and vegetables, plant-based proteins, whole-grain carbohydrates was discussed in detail with the patient.   A list for source of those nutrients were also provided to the patient.  Patient will use only water or unsweetened tea for hydration. B.  The need to stay away from risky substances including alcohol, smoking; obtaining 7 to 9 hours of restorative sleep, at least 150 minutes of moderate intensity exercise weekly, the importance of healthy social connections,  and stress reduction techniques were discussed. C.  A full color page of  Calorie density of various food groups per pound showing examples of each food groups was provided to the patient.  - The patient admits there is a room for improvement in their diet and drink choices. -  Suggestion is made for the patient to avoid simple carbohydrates from their diet including Cakes, Sweet Desserts / Pastries, Ice Cream, Soda (diet and regular), Sweet Tea, Candies, Chips, Cookies, Sweet Pastries, Store Bought Juices, Alcohol in Excess of 1-2 drinks a day, Artificial Sweeteners, Coffee Creamer, and "Sugar-free" Products. This will help patient to have stable blood glucose profile and potentially avoid unintended weight gain.   - I encouraged the patient to switch to unprocessed or minimally processed complex starch and increased protein intake (animal or plant source), fruits, and vegetables.   - Patient is advised to stick to a routine mealtimes to eat 3 meals a day and avoid unnecessary snacks (to snack only to correct hypoglycemia).  I will check insulin and cpeptide levels, and A1c as well as repeating her CMP to help rule out other potential causes of hypoglycemia.  - I did not initiate any new prescriptions today.  - she is advised to maintain close follow up with Patient, No Pcp Per for primary care needs.  I spent  38  minutes in the care of the patient today including review of labs from CMP, Lipids, Thyroid Function, Hematology  (current and previous including abstractions from other facilities); face-to-face time discussing  her blood glucose readings/logs, discussing hypoglycemia and hyperglycemia episodes and symptoms, medications doses, her options of short and long term treatment based on the latest standards of care / guidelines;  discussion about incorporating lifestyle medicine;  and documenting the encounter. Risk reduction counseling performed per USPSTF guidelines to reduce obesity and cardiovascular risk factors.     Please refer to Patient Instructions for Blood Glucose Monitoring and Insulin/Medications Dosing Guide"  in media tab for additional information. Please  also refer to " Patient Self Inventory" in the Media  tab for reviewed elements of pertinent patient history.  [place] S Authement participated in the discussions, expressed understanding, and voiced agreement with the above plans.  All questions were answered to her satisfaction. she is encouraged to contact clinic should she have any questions or concerns prior to her return visit.  Follow up plan: Return if symptoms worsen or fail to improve.   Ronny Bacon, [facility] [address] [place], [place] [address] Phone: [phone] Fax: [phone]   04/11/2023, 9:45 AM

## 2023-04-11 NOTE — Patient Instructions (Signed)

## 2023-09-19 ENCOUNTER — Ambulatory Visit: Payer: Self-pay

## 2023-09-20 ENCOUNTER — Ambulatory Visit
Admission: RE | Admit: 2023-09-20 | Discharge: 2023-09-20 | Disposition: A | Discharge status: Home / Self Care | Payer: No Typology Code available for payment source | Source: Ambulatory Visit | Attending: Family Medicine | Admitting: Family Medicine

## 2023-09-20 ENCOUNTER — Ambulatory Visit: Payer: Self-pay

## 2023-09-20 VITALS — BP 114/74 | HR 96 | Temp 98.3°F | Resp 16

## 2023-09-20 DIAGNOSIS — J0100 Acute maxillary sinusitis, unspecified: Secondary | ICD-10-CM | POA: Diagnosis not present

## 2023-09-20 MED ORDER — AMOXICILLIN-POT CLAVULANATE 875-125 MG PO TABS: 1.0000 | ORAL_TABLET | Freq: Two times a day (BID) | ORAL | 0 refills | Status: AC

## 2023-09-20 MED ORDER — FLUTICASONE PROPIONATE 50 MCG/ACT NA SUSP: 1.0000 | Freq: Two times a day (BID) | NASAL | 2 refills | Status: AC

## 2023-09-20 NOTE — ED Triage Notes (Addendum)
Pt states cough and cnngestion for the past week.

## 2023-09-20 NOTE — ED Provider Notes (Signed)
[facility]    CSN: [id number] Arrival date & time: 09/20/23  1647      History   Chief Complaint Chief Complaint  Patient presents with   Cough    Upper respiratory illness for over one week - Entered by patient    HPI Jennifer Potts is a 27 y.o. female.   Patient presenting today with over a week of progressively worsening nasal congestion, facial pain and pressure, productive cough, fatigue.  Had fevers the first few days but none since.  So far trying Mucinex, cough syrups with minimal relief.  No known history of chronic pulmonary disease.  Multiple sick contacts recently.    Past Medical History:  Diagnosis Date   IBS (irritable bowel syndrome)    Diarrhea type    Patient Active Problem List   Diagnosis Date Noted   Irritable bowel syndrome with diarrhea 01/10/2017    Past Surgical History:  Procedure Laterality Date   WISDOM TOOTH EXTRACTION      OB History   No obstetric history on file.      Home Medications    Prior to Admission medications   Medication Sig Start Date End Date Taking? Authorizing Provider  amoxicillin-clavulanate (AUGMENTIN) 875-125 MG tablet Take 1 tablet by mouth every 12 (twelve) hours. 09/20/23  Yes Particia Nearing, PA-C  fluticasone [facility]) 50 MCG/ACT nasal spray Place 1 spray into both nostrils 2 (two) times daily. 09/20/23  Yes Particia Nearing, PA-C  cetirizine (ZYRTEC) 10 MG tablet Take 10 mg by mouth daily.    [provider]  colestipol (COLESTID) 1 g tablet Take 1 g by mouth daily.  10/20/19   [provider]  ibuprofen (ADVIL,MOTRIN) 200 MG tablet Take 200 mg by mouth every 6 (six) hours as needed.    [provider]    Family History Family History  Problem Relation Age of Onset   Diabetes Maternal Grandfather    Diabetes Paternal Grandmother    Colon cancer Paternal Grandmother    Cancer Paternal Grandfather        "abdominal"    Social History Social  History   Tobacco Use   Smoking status: Never   Smokeless tobacco: Never  Substance Use Topics   Alcohol use: No   Drug use: No     Allergies   Patient has no known allergies.   Review of Systems Review of Systems Per HPI  Physical Exam Triage Vital Signs ED Triage Vitals [09/20/23 1703]  Encounter Vitals Group     BP 114/74     Systolic BP Percentile      Diastolic BP Percentile      Pulse Rate 96     Resp 16     Temp 98.3 F (36.8 C)     Temp Source Oral     SpO2 98 %     Weight      Height      Head Circumference      Peak Flow      Pain Score 0     Pain Loc      Pain Education      Exclude from Growth Chart    No data found.  Updated Vital Signs BP 114/74 (BP Location: Right Arm)   Pulse 96   Temp 98.3 F (36.8 C) (Oral)   Resp 16   LMP 08/23/2023 (Approximate)   SpO2 98%   Visual Acuity Right Eye Distance:   Left Eye Distance:  Bilateral Distance:    Right Eye Near:   Left Eye Near:    Bilateral Near:     Physical Exam Vitals and nursing note reviewed.  Constitutional:      Appearance: Normal appearance.  HENT:     Head: Atraumatic.     Right Ear: Tympanic membrane and external ear normal.     Left Ear: Tympanic membrane and external ear normal.     Nose: Congestion present.     Mouth/Throat:     Mouth: Mucous membranes are moist.     Pharynx: Posterior oropharyngeal erythema present.  Eyes:     Extraocular Movements: Extraocular movements intact.     Conjunctiva/sclera: Conjunctivae normal.  Cardiovascular:     Rate and Rhythm: Normal rate and regular rhythm.     Heart sounds: Normal heart sounds.  Pulmonary:     Effort: Pulmonary effort is normal.     Breath sounds: Normal breath sounds. No wheezing or rales.  Musculoskeletal:        General: Normal range of motion.     Cervical back: Normal range of motion and neck supple.  Skin:    General: Skin is warm and dry.  Neurological:     Mental Status: She is alert and  oriented to person, place, and time.  Psychiatric:        Mood and Affect: Mood normal.        Thought Content: Thought content normal.      UC Treatments / Results  Labs (all labs ordered are listed, but only abnormal results are displayed) Labs Reviewed - No data to display  EKG   Radiology No results found.  Procedures Procedures (including critical care time)  Medications Ordered in UC Medications - No data to display  Initial Impression / Assessment and Plan / UC Course  I have reviewed the triage vital signs and the nursing notes.  Pertinent labs & imaging results that were available during my care of the patient were reviewed by me and considered in my medical decision making (see chart for details).     Given duration and worsening course, treat with Augmentin, Flonase, sinus rinses, supportive medications and home care.  Return for worsening symptoms. Final Clinical Impressions(s) / UC Diagnoses   Final diagnoses:  Acute non-recurrent maxillary sinusitis   Discharge Instructions   None    ED Prescriptions     Medication Sig Dispense Auth. Provider   amoxicillin-clavulanate (AUGMENTIN) 875-125 MG tablet Take 1 tablet by mouth every 12 (twelve) hours. 14 tablet Particia Nearing, PA-C   fluticasone [facility]) 50 MCG/ACT nasal spray Place 1 spray into both nostrils 2 (two) times daily. 16 g Particia Nearing, [place]      PDMP not reviewed this encounter.   Particia Nearing, [place] 09/20/23 1733

## 2023-10-09 ENCOUNTER — Other Ambulatory Visit: Payer: Self-pay | Admitting: Gastroenterology

## 2023-10-09 DIAGNOSIS — R1011 Right upper quadrant pain: Secondary | ICD-10-CM

## 2023-10-12 ENCOUNTER — Ambulatory Visit
Admission: RE | Admit: 2023-10-12 | Discharge: 2023-10-12 | Disposition: A | Discharge status: Home / Self Care | Payer: No Typology Code available for payment source | Source: Ambulatory Visit | Attending: Gastroenterology

## 2023-10-12 DIAGNOSIS — R1011 Right upper quadrant pain: Secondary | ICD-10-CM

## 2024-03-06 ENCOUNTER — Encounter: Payer: Self-pay | Admitting: Podiatry

## 2024-03-06 ENCOUNTER — Ambulatory Visit (INDEPENDENT_AMBULATORY_CARE_PROVIDER_SITE_OTHER): Admitting: Podiatry

## 2024-03-06 VITALS — Ht 65.0 in | Wt 127.0 lb

## 2024-03-06 DIAGNOSIS — B351 Tinea unguium: Secondary | ICD-10-CM | POA: Diagnosis not present

## 2024-03-06 MED ORDER — TERBINAFINE HCL 250 MG PO TABS: 250.0000 mg | ORAL_TABLET | Freq: Every day | ORAL | 0 refills | Status: AC

## 2024-03-06 NOTE — Progress Notes (Signed)
  Subjective:  Patient ID: Jennifer Potts, female    DOB: [birth year],  MRN: [id number]  Chief Complaint  Patient presents with   Nail Problem    Pt is here due to what she believe is a toenail fungus that began 2 weeks ago, has tried OTC medicines with no results.    Discussed the use of AI scribe software for clinical note transcription with the patient, who gave verbal consent to proceed.  History of Present Illness Jennifer Potts is a 28 year old female who presents with nail fungus on her toes.  She has developed nail fungus on her toes, which she believes started after a pedicure. She frequently receives pedicures and has attempted various over-the-counter treatments without success. The affected nails do not cause pain.  She recalls possibly stubbing her second toe or having someone step on her foot, which may have contributed to the condition. The issue is primarily with two toenails, and she confirms that it is not residual nail polish.  No history of skin conditions such as eczema or psoriasis. No itching, burning, or symptoms of athlete's foot. No history of liver or kidney issues, which is relevant to potential treatment options.      Objective:    Physical Exam VASCULAR: DP and PT pulse palpable. Foot is warm and well-perfused. Capillary fill time is brisk. DERMATOLOGIC: Mycotic dystrophic nails with onycholysis on hallux and left second toenail. Normal skin turgor, texture, and temperature. No open lesions, rashes, or ulcerations. NEUROLOGIC: Normal sensation to light touch and pressure. No paresthesias on examination. ORTHOPEDIC: Smooth, pain-free range of motion of all examined joints. No ecchymosis or bruising. No gross deformity. No pain to palpation.        Results     Assessment:   1. Onychomycosis      Plan:  Patient was evaluated and treated and all questions answered.  Assessment and Plan Assessment & Plan Onychomycosis Onychomycosis  affecting the hallux and second toenail of the left foot, in early stages without significant nail thickening. She reports frequent pedicures and trauma to the second toenail. No pain, skin conditions, or tinea pedis symptoms are present. - Prescribe oral terbinafine (Lamisil) for 3 months. - Inform about potential gastrointestinal side effects, including stomach cramps and diarrhea, and advise discontinuation if she occurs. - Instruct to report if nails become painful, hot, red, or swollen. - Advise discontinuation of over-the-counter topical treatments. - Permit use of nail polish during treatment, with monthly removal for inspection. - Schedule follow-up after 4 months to assess treatment response.  If no improvement would recommend temporary nail avulsion and repeat Lamisil      Return in about 4 months (around 07/06/2024) for follow up after nail fungus treatment.

## 2024-03-06 NOTE — Patient Instructions (Addendum)
  VISIT SUMMARY: Today, we discussed the nail fungus on your toes, which you believe started after a pedicure. You have tried various over-the-counter treatments without success. The affected nails are not causing you pain, and there are no other skin conditions or symptoms present.  YOUR PLAN: -ONYCHOMYCOSIS: Onychomycosis is a fungal infection of the nails. It is affecting the big toe and second toenail of your left foot. We will treat this with oral terbinafine (Lamisil) for 3 months. Be aware of potential side effects like stomach cramps and diarrhea, and stop the medication if these occur. Report any pain, redness, or swelling in the nails. Stop using over-the-counter topical treatments. You can use nail polish during treatment, but remove it monthly for inspection. We will follow up in 4 months to see how the treatment is working.  INSTRUCTIONS: Please schedule a follow-up appointment in 4 months to assess your response to the treatment.                      Contains text generated by Abridge.                                 Contains text generated by Abridge.

## 2024-07-07 ENCOUNTER — Ambulatory Visit: Admitting: Podiatry

## 2024-07-29 ENCOUNTER — Ambulatory Visit: Admitting: Podiatry
